# Patient Record
Sex: Female | Born: 1960 | Race: White | Hispanic: No | Smoking: Former smoker
Health system: Southern US, Community
[De-identification: ages and names within clinical notes are randomized; demographics above are authoritative.]

## PROBLEM LIST (undated history)

## (undated) DIAGNOSIS — R112 Nausea with vomiting, unspecified: Secondary | ICD-10-CM

## (undated) DIAGNOSIS — E039 Hypothyroidism, unspecified: Secondary | ICD-10-CM

## (undated) DIAGNOSIS — I10 Essential (primary) hypertension: Secondary | ICD-10-CM

## (undated) DIAGNOSIS — K219 Gastro-esophageal reflux disease without esophagitis: Secondary | ICD-10-CM

## (undated) HISTORY — PX: COLONOSCOPY: SHX174

## (undated) HISTORY — PX: APPENDECTOMY: SHX54

## (undated) HISTORY — PX: ABLATION: SHX5711

---

## 2000-06-25 ENCOUNTER — Other Ambulatory Visit: Admission: RE | Admit: 2000-06-25 | Discharge: 2000-06-25 | Payer: Self-pay | Admitting: Obstetrics and Gynecology

## 2001-07-05 ENCOUNTER — Other Ambulatory Visit: Admission: RE | Admit: 2001-07-05 | Discharge: 2001-07-05 | Payer: Self-pay | Admitting: Obstetrics and Gynecology

## 2002-07-14 ENCOUNTER — Encounter: Payer: Self-pay | Admitting: Family Medicine

## 2002-07-14 ENCOUNTER — Observation Stay (HOSPITAL_COMMUNITY): Admission: EM | Admit: 2002-07-14 | Discharge: 2002-07-15 | Payer: Self-pay | Admitting: Emergency Medicine

## 2002-08-28 ENCOUNTER — Other Ambulatory Visit: Admission: RE | Admit: 2002-08-28 | Discharge: 2002-08-28 | Payer: Self-pay | Admitting: Obstetrics and Gynecology

## 2003-07-23 ENCOUNTER — Other Ambulatory Visit: Admission: RE | Admit: 2003-07-23 | Discharge: 2003-07-23 | Payer: Self-pay | Admitting: Internal Medicine

## 2003-11-01 ENCOUNTER — Other Ambulatory Visit: Admission: RE | Admit: 2003-11-01 | Discharge: 2003-11-01 | Payer: Self-pay | Admitting: Obstetrics and Gynecology

## 2004-06-23 ENCOUNTER — Ambulatory Visit (HOSPITAL_COMMUNITY): Admission: RE | Admit: 2004-06-23 | Discharge: 2004-06-23 | Payer: Self-pay | Admitting: Internal Medicine

## 2004-11-05 ENCOUNTER — Other Ambulatory Visit: Admission: RE | Admit: 2004-11-05 | Discharge: 2004-11-05 | Payer: Self-pay | Admitting: Obstetrics and Gynecology

## 2007-01-20 ENCOUNTER — Encounter (INDEPENDENT_AMBULATORY_CARE_PROVIDER_SITE_OTHER): Payer: Self-pay | Admitting: Obstetrics and Gynecology

## 2007-01-20 ENCOUNTER — Ambulatory Visit (HOSPITAL_COMMUNITY): Admission: RE | Admit: 2007-01-20 | Discharge: 2007-01-20 | Payer: Self-pay | Admitting: Obstetrics and Gynecology

## 2008-12-21 ENCOUNTER — Emergency Department (HOSPITAL_COMMUNITY): Admission: EM | Admit: 2008-12-21 | Discharge: 2008-12-21 | Payer: Self-pay | Admitting: Emergency Medicine

## 2010-03-18 ENCOUNTER — Ambulatory Visit (HOSPITAL_COMMUNITY): Admission: RE | Admit: 2010-03-18 | Discharge: 2010-03-18 | Payer: Self-pay | Admitting: Internal Medicine

## 2010-04-03 IMAGING — US US ABDOMEN COMPLETE
1 series · 14 of 25 positions shown · non-contrast
Comparison: None

CLINICAL DATA: Abdominal pain.

ABDOMINAL ULTRASOUND COMPLETE

[Series 1: us abdomen complete · 0.32mm/px · 14 of 77 slices shown]
[im 1/77]
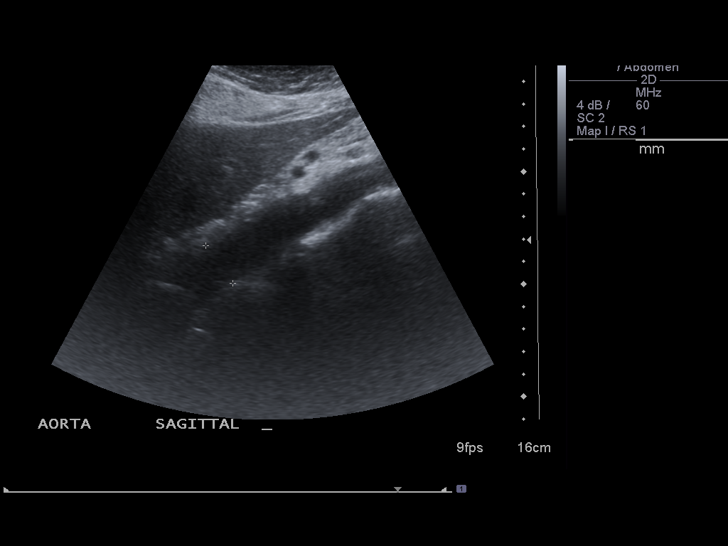
[im 7/77]
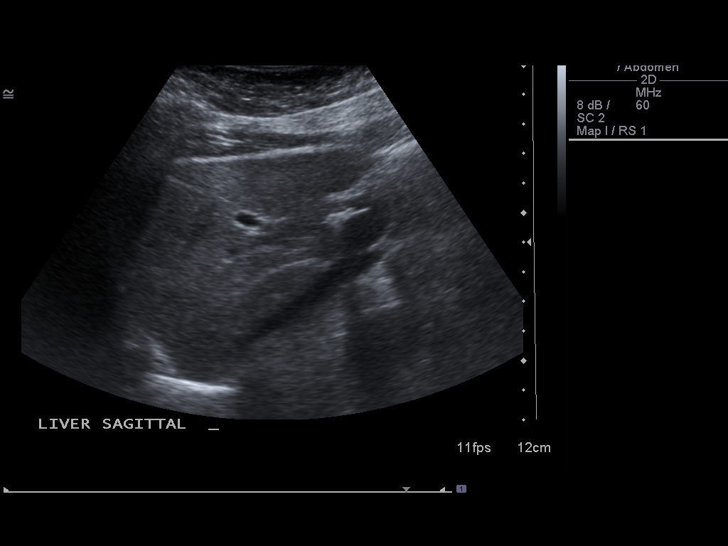
[im 13/77]
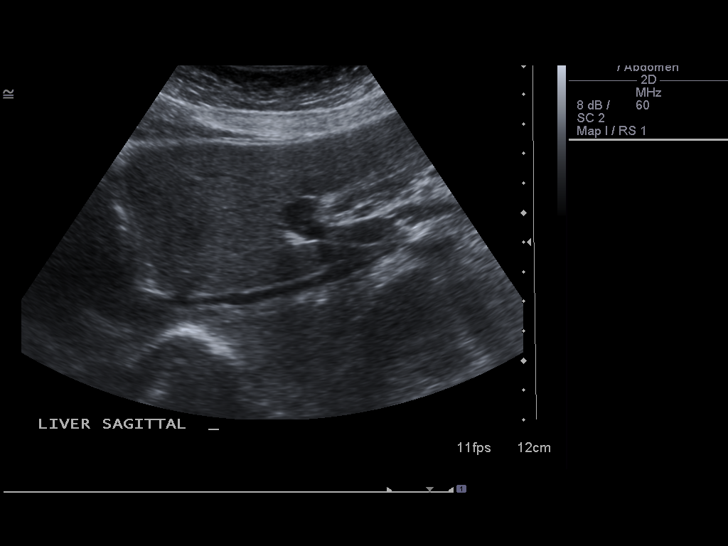
[im 20/77]
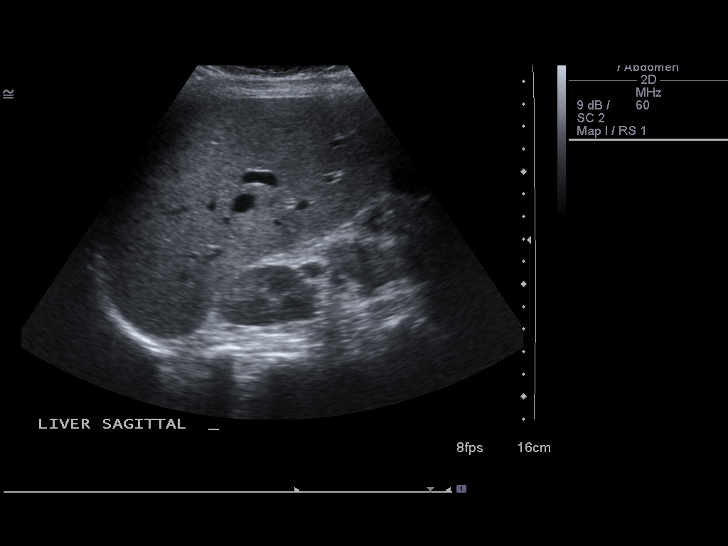
[im 26/77]
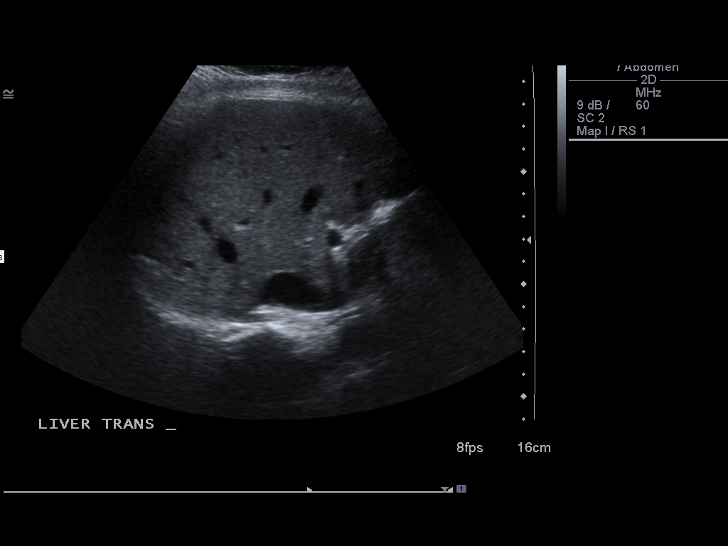
[im 29/77]
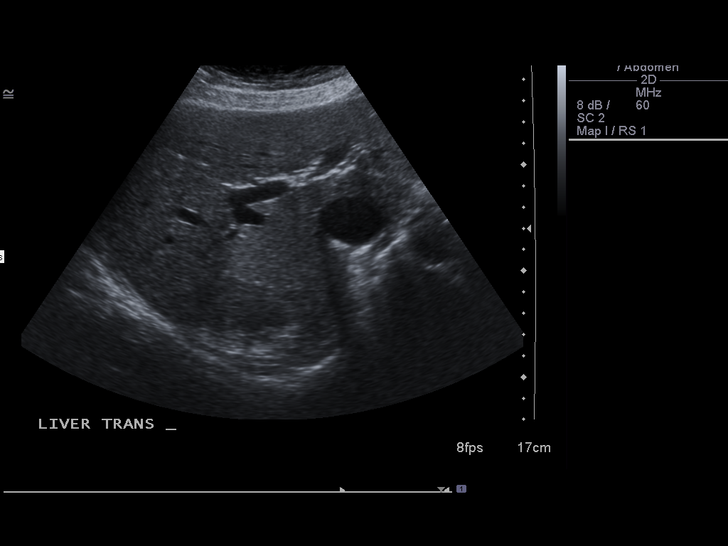
[im 35/77]
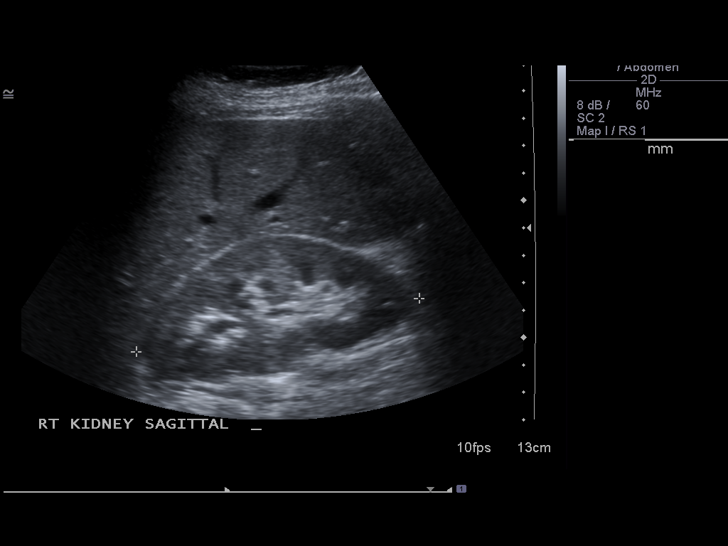
[im 42/77]
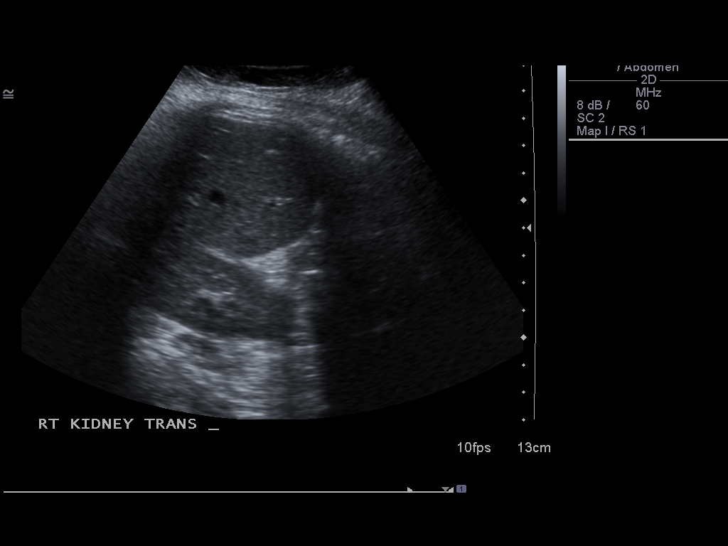
[im 48/77]
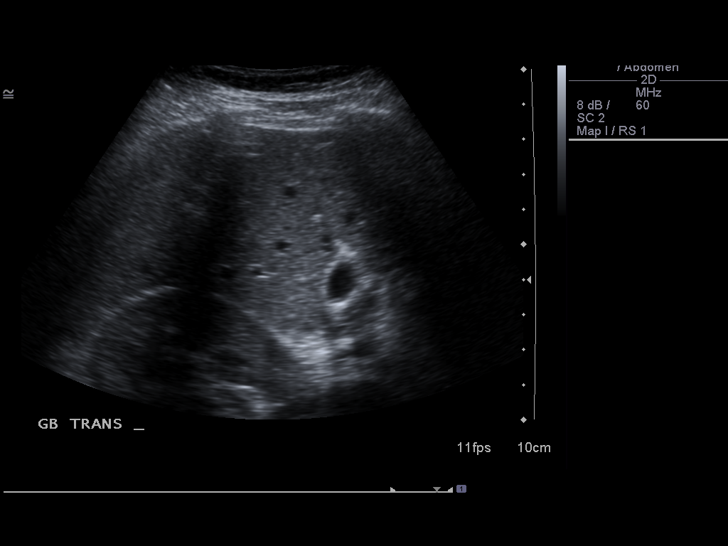
[im 51/77]
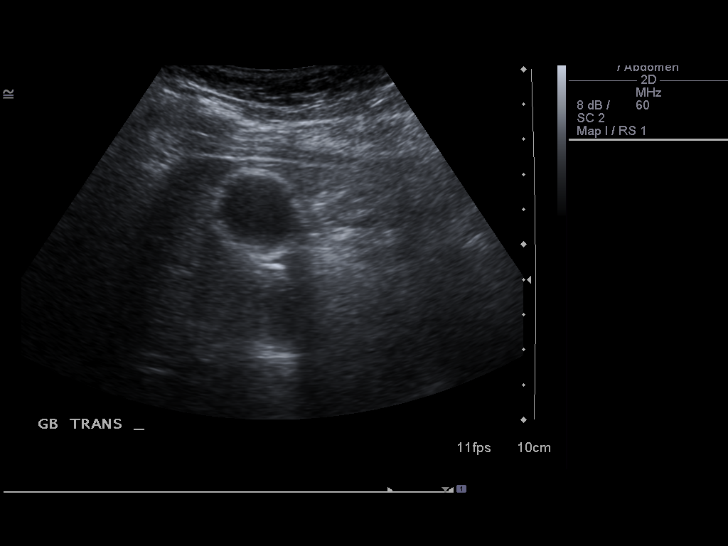
[im 58/77]
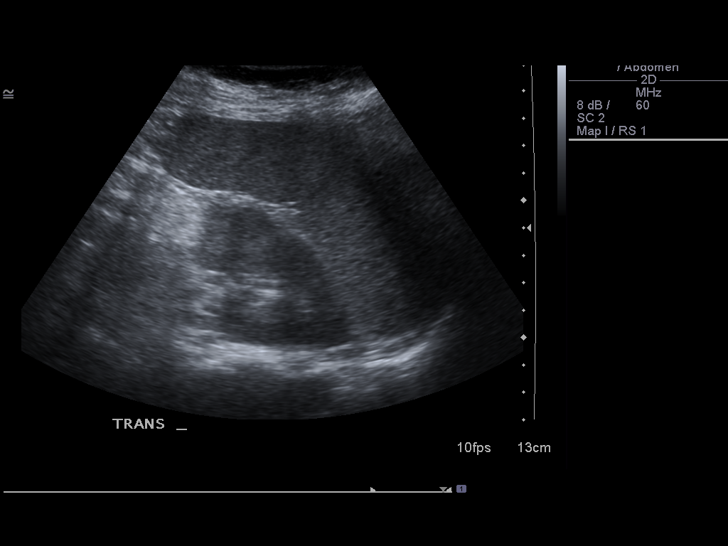
[im 64/77]
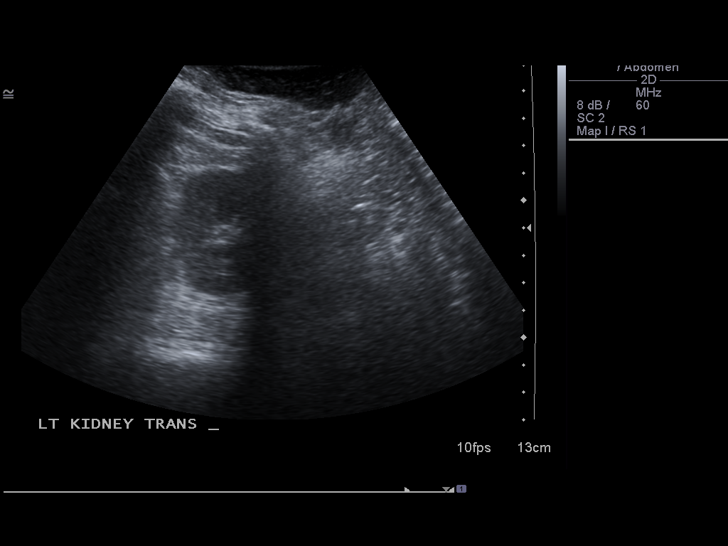
[im 70/77]
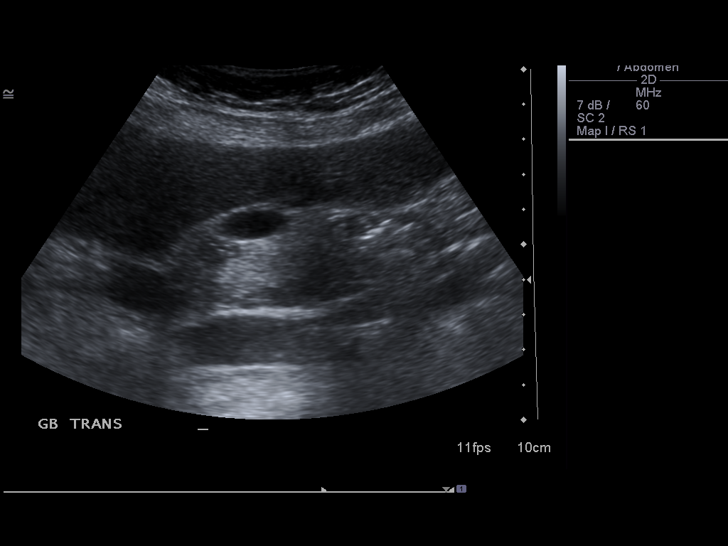
[im 77/77]
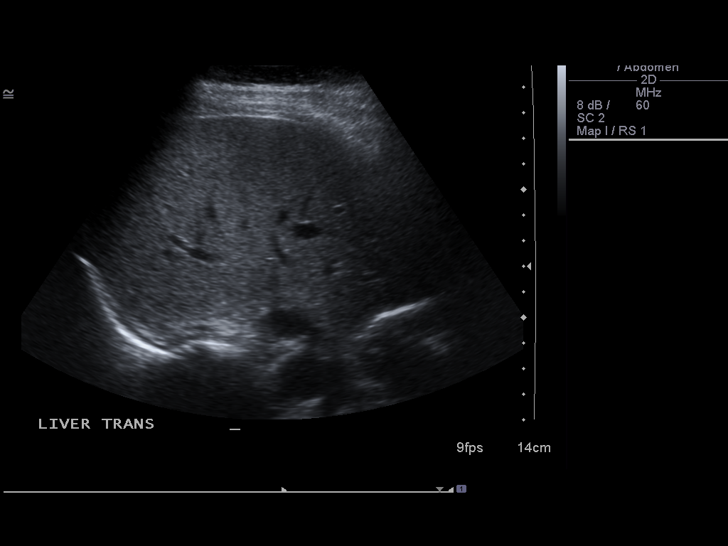

[14 of 25 positions shown; findings below may reference images not displayed]

FINDINGS: Gallbladder:  No gallstones, gallbladder wall thickening, or
pericholecystic fluid. 

Common bile duct: Within normal limits in caliber.

Liver:  No focal lesion identified.  Within normal limits in
parenchymal echogenicity.

Inferior vena cava:  Intrahepatic portion appears normal.

Pancreas:  Although entire pancreas not visualized due to bowel
gas, no abnormality identified.

Spleen:  Within normal limits in size and echogenicity.

Right kidney:  Normal in size and parenchymal echogenicity.  No
evidence of mass or hydronephrosis.

Left kidney:  Normal in size and parenchymal echogenicity.   No
evidence of mass or hydronephrosis.

Abdominal aorta:  No aneurysm identified.
IMPRESSION: Negative abdominal ultrasound.

## 2010-12-03 LAB — COMPREHENSIVE METABOLIC PANEL
ALT: 14 U/L (ref 0–35)
AST: 25 U/L (ref 0–37)
Albumin: 3.9 g/dL (ref 3.5–5.2)
Alkaline Phosphatase: 75 U/L (ref 39–117)
BUN: 21 mg/dL (ref 6–23)
CO2: 24 mEq/L (ref 19–32)
Calcium: 9.7 mg/dL (ref 8.4–10.5)
Chloride: 101 mEq/L (ref 96–112)
Creatinine, Ser: 1.93 mg/dL — ABNORMAL HIGH (ref 0.4–1.2)
GFR calc Af Amer: 34 mL/min — ABNORMAL LOW (ref >60)
GFR calc non Af Amer: 28 mL/min — ABNORMAL LOW (ref >60)
Glucose, Bld: 92 mg/dL (ref 70–99)
Potassium: 4.1 mEq/L (ref 3.5–5.1)
Sodium: 135 mEq/L (ref 135–145)
Total Bilirubin: 0.7 mg/dL (ref 0.3–1.2)
Total Protein: 7.1 g/dL (ref 6.0–8.3)

## 2010-12-03 LAB — DIFFERENTIAL
Basophils Absolute: 0.1 10*3/uL (ref 0.0–0.1)
Basophils Relative: 1 % (ref 0–1)
Eosinophils Absolute: 0 10*3/uL (ref 0.0–0.7)
Eosinophils Relative: 0 % (ref 0–5)
Lymphocytes Relative: 12 % (ref 12–46)
Lymphs Abs: 1.1 10*3/uL (ref 0.7–4.0)
Monocytes Absolute: 1 10*3/uL (ref 0.1–1.0)
Monocytes Relative: 11 % (ref 3–12)
Neutro Abs: 6.9 10*3/uL (ref 1.7–7.7)
Neutrophils Relative %: 76 % (ref 43–77)

## 2010-12-03 LAB — CBC
HCT: 40.1 % (ref 36.0–46.0)
Hemoglobin: 14.1 g/dL (ref 12.0–15.0)
MCHC: 35.2 g/dL (ref 30.0–36.0)
MCV: 89.9 fL (ref 78.0–100.0)
Platelets: 229 10*3/uL (ref 150–400)
RBC: 4.46 MIL/uL (ref 3.87–5.11)
RDW: 13.5 % (ref 11.5–15.5)
WBC: 9.1 10*3/uL (ref 4.0–10.5)

## 2010-12-03 LAB — URINALYSIS, ROUTINE W REFLEX MICROSCOPIC
Bilirubin Urine: NEGATIVE
Glucose, UA: NEGATIVE mg/dL
Hgb urine dipstick: NEGATIVE
Ketones, ur: NEGATIVE mg/dL
Leukocytes, UA: NEGATIVE
Nitrite: NEGATIVE
Protein, ur: 30 mg/dL — AB
Specific Gravity, Urine: 1.012 (ref 1.005–1.030)
Urobilinogen, UA: 0.2 mg/dL (ref 0.0–1.0)
pH: 5 (ref 5.0–8.0)

## 2010-12-03 LAB — POCT PREGNANCY, URINE: Preg Test, Ur: NEGATIVE

## 2010-12-03 LAB — URINE MICROSCOPIC-ADD ON

## 2010-12-03 LAB — LIPASE, BLOOD: Lipase: 19 U/L (ref 11–59)

## 2011-01-06 NOTE — Op Note (Signed)
Erica Hamilton, Erica Hamilton                ACCOUNT NO.:  [id number]   MEDICAL RECORD NO.:  [id number]          PATIENT TYPE:  AMB   LOCATION:  SDC                           FACILITY:  [facility]   PHYSICIAN:  Malva Limes, M.D.    DATE OF BIRTH:  1961/02/04   DATE OF PROCEDURE:  01/20/2007  DATE OF DISCHARGE:                               OPERATIVE REPORT   PREOPERATIVE DIAGNOSES:  1. Menorrhagia.  2. Submucous fibroid.   POSTOPERATIVE DIAGNOSES:  1. Menorrhagia.  2. Submucous fibroid.   PROCEDURE:  1. Hysteroscopy.  2. Dilation curettage.  3. NovaSure endometrial ablation.   SURGEON:  Malva Limes, M.D.   ANESTHESIA:  General endotracheal.   ANTIBIOTICS:  Ancef 1 g.   ESTIMATED BLOOD LOSS:  Minimal.   SPECIMENS:  Endometrial curettings sent to pathology.   COMPLICATIONS:  None.   DESCRIPTION OF PROCEDURE:  The patient was taken to the operating room  where she was placed in the dorsal supine position.  A general  anesthetic was administered without complications.  She was then placed  in the dorsal lithotomy position.  She was prepped with Betadine and her  bladder was drained with a catheter.   A sterile speculum was placed in the vagina; 10 mL of 1% lidocaine was  used for a paracervical block.  The cervix was then serially dilated to  a 42 [place].  The hysteroscope was advanced into the uterine cavity  where there was no evidence of any polyps.  There was one submucous  fibroid on the anterior left cornual area, which only minimally invaded  the uterine cavity.  It was felt that endometrial ablation with NovaSure  would easily be accomplished with this submucous fibroid.   At that point, the hysteroscope was removed.  A D&C was performed.  The  uterus was sounded to 8 cm; the cervical length was 2.5 cm; giving an  intracavitary length of 5.5 cm.  The NovaSure device was placed into the  uterine cavity and opened the width was 3.8 cm.  A seal test was  performed in past.   The NovaSure device was turned on for 70  seconds getting a total wattage 115.  This concluded the procedure.  The  patient was taken to the recovery room in stable condition.  Instrument  and lack counts were correct x1.  The patient will be discharged to home  with Percocet taken p.r.n.  She will follow up in the office in 4 weeks.           ______________________________  Malva Limes, M.D.     MA/MEDQ  D:  01/20/2007  T:  01/20/2007  Job:  [id number]

## 2011-01-09 NOTE — Discharge Summary (Signed)
Erica Hamilton, Erica Hamilton                            ACCOUNT NO.:  [id number]   MEDICAL RECORD NO.:  [id number]                   PATIENT TYPE:  INP   LOCATION:  4736                                 FACILITY:  [facility]   PHYSICIAN:  Dani Gobble, M.D.                  DATE OF BIRTH:  1961/01/25   DATE OF ADMISSION:  07/14/2002  DATE OF DISCHARGE:  07/15/2002                                 DISCHARGE SUMMARY   ADMISSION DIAGNOSES:  1. Hypertension.  2. Chest pain.  3. Tachycardia.  4. Recent use of high caffeine with weight loss medications.   DISCHARGE DIAGNOSES:  1. Hypertension.  2. Chest pain.  3. Tachycardia.  4. Recent use of high caffeine with weight loss medications.   HISTORY OF PRESENT ILLNESS:  The patient is a 50 year old white female who  had been in her usual state of good health.  She had been taking Zantrex-3  and the last dose was at lunch on the day prior to admission; as well, she  had been experiencing some sinus symptoms and she took over-the-counter  sinus medicines.  Over the past two to three nights, she has been feeling  her heart rate and her heart rate has been feeling as if it were increasing  and feels as though she could not get a breath in very well.  On the day of  admission, she also had chest heaviness and a perceived chest pain which was  somewhat dull.  She was seen at the urgent care and was noted to have  nonspecific ST-T changes on EKG and was sent to the ER for further  evaluation.  She is currently pain-free.   On exam at that time, blood pressure was 123/91, heart rate 78, oxygen  saturation 99% on room air.  No significant findings on physical exam.   EKG #1 showed sinus tachycardia at 100 beats a minute, nonspecific changes.  EKG #2 showed heart rate of 78, normal sinus rhythm, no acute change.  Initial labs are stable.   At this time, she is being evaluated by Dr. Dani Gobble.  It was reviewed  that she had been taking this Zantrex-3,  which has extremely high levels of  caffeine in it and it is a diet drug which over the counter; as well, she  had been using some over-the-counter medication for her sinus congestion.  At that time, she was planned for admission to telemetry ______________ for  rule out MI.  She would be placed on aspirin, heparin and beta blocker and  nitroglycerin as needed.  She was scheduled for 2-D echocardiogram and was  planned for observation overnight.   HOSPITAL COURSE:  On the morning of July 15, 2002, the patient has  remained stable.  Her heart rate is 60, blood pressure is 115/65.  Enzymes  were negative and EKG continues to show no changes.  She had no risk factors  for coronary disease and it was felt that her symptoms were atypical for  angina.  At that time, we stopped the heparin, had her ambulate in the  hallway.  She remained stable and was discharged home later that afternoon.   HOSPITAL CONSULTATIONS:  None.   HOSPITAL PROCEDURES:  None.   LABORATORY AND ACCESSORY CLINICAL DATA:  Urinalysis is negative.  Blood  screen is positive for amphetamines, otherwise, they were negative.  TSH is  1.460.  Urine pregnancy is negative.  Cardiac enzymes show CKs of 91, 79 and  62; MBs of 0.9, 0.6, 0.6; troponin is 0.2, less than 0.1 and 0.1.  Liver  function tests are normal.  Sodium is 136, potassium 3.8, chloride 106, CO2  25, glucose 87, BUN 14, creatinine 1.0.  White count 7.1, hemoglobin 13.2,  hematocrit 38.6 and platelets 328,000.  PT 13.5, INR 1.0, PTT 35;  thereafter, the PTT was elevated on IV heparin.   EKG shows normal sinus rhythm with nonspecific ST-T change.   DISCHARGE MEDICATIONS:  1. Birth control pills.  2. Zantac.   SPECIAL DISCHARGE INSTRUCTIONS:  1. Stop taking diet pills.  2  Avoid caffeine and stimulants.  1. Call [phone] and make an appointment to be seen in the future if she has     any further cardiac problems.   FOLLOWUP:  We will be seeing her on an  as-needed basis.     Erica Hamilton, P.A.-C.                   Dani Gobble, M.D.    MBE/MEDQ  D:  07/18/2002  T:  07/19/2002  Job:  [id number]

## 2011-02-27 ENCOUNTER — Emergency Department (HOSPITAL_COMMUNITY): Payer: 59

## 2011-02-27 ENCOUNTER — Emergency Department (HOSPITAL_COMMUNITY)
Admission: EM | Admit: 2011-02-27 | Discharge: 2011-02-27 | Disposition: A | Discharge status: Home / Self Care | Payer: 59 | Attending: Emergency Medicine | Admitting: Emergency Medicine

## 2011-02-27 DIAGNOSIS — R079 Chest pain, unspecified: Secondary | ICD-10-CM | POA: Insufficient documentation

## 2011-02-27 LAB — DIFFERENTIAL
Basophils Absolute: 0 10*3/uL (ref 0.0–0.1)
Basophils Relative: 1 % (ref 0–1)
Eosinophils Absolute: 0.1 10*3/uL (ref 0.0–0.7)
Eosinophils Relative: 2 % (ref 0–5)
Lymphocytes Relative: 42 % (ref 12–46)
Lymphs Abs: 2.4 10*3/uL (ref 0.7–4.0)
Monocytes Absolute: 0.5 10*3/uL (ref 0.1–1.0)
Monocytes Relative: 9 % (ref 3–12)
Neutro Abs: 2.7 10*3/uL (ref 1.7–7.7)
Neutrophils Relative %: 46 % (ref 43–77)

## 2011-02-27 LAB — CK TOTAL AND CKMB (NOT AT ARMC)
CK, MB: 1.5 ng/mL (ref 0.3–4.0)
Relative Index: INVALID (ref 0.0–2.5)
Total CK: 62 U/L (ref 7–177)

## 2011-02-27 LAB — CBC
HCT: 40.7 % (ref 36.0–46.0)
Hemoglobin: 13.9 g/dL (ref 12.0–15.0)
MCH: 30.8 pg (ref 26.0–34.0)
MCHC: 34.2 g/dL (ref 30.0–36.0)
MCV: 90.2 fL (ref 78.0–100.0)
Platelets: 232 10*3/uL (ref 150–400)
RBC: 4.51 MIL/uL (ref 3.87–5.11)
RDW: 12.2 % (ref 11.5–15.5)
WBC: 5.7 10*3/uL (ref 4.0–10.5)

## 2011-02-27 LAB — BASIC METABOLIC PANEL
BUN: 16 mg/dL (ref 6–23)
CO2: 26 mEq/L (ref 19–32)
Calcium: 9.9 mg/dL (ref 8.4–10.5)
Chloride: 100 mEq/L (ref 96–112)
Creatinine, Ser: 0.73 mg/dL (ref 0.50–1.10)
GFR calc Af Amer: >60 mL/min (ref >60)
GFR calc non Af Amer: >60 mL/min (ref >60)
Glucose, Bld: 97 mg/dL (ref 70–99)
Potassium: 4.1 mEq/L (ref 3.5–5.1)
Sodium: 137 mEq/L (ref 135–145)

## 2011-02-27 LAB — TROPONIN I: Troponin I: <0.3 ng/mL (ref <0.30)

## 2012-04-04 ENCOUNTER — Ambulatory Visit (HOSPITAL_COMMUNITY)
Admission: RE | Admit: 2012-04-04 | Discharge: 2012-04-04 | Disposition: A | Discharge status: Home / Self Care | Payer: 59 | Source: Ambulatory Visit | Attending: Internal Medicine | Admitting: Internal Medicine

## 2012-04-04 ENCOUNTER — Other Ambulatory Visit (HOSPITAL_COMMUNITY): Payer: Self-pay | Admitting: Internal Medicine

## 2012-04-04 DIAGNOSIS — S93401A Sprain of unspecified ligament of right ankle, initial encounter: Secondary | ICD-10-CM

## 2012-04-04 DIAGNOSIS — M25579 Pain in unspecified ankle and joints of unspecified foot: Secondary | ICD-10-CM | POA: Insufficient documentation

## 2012-09-16 ENCOUNTER — Encounter (HOSPITAL_COMMUNITY): Payer: Self-pay | Admitting: Emergency Medicine

## 2012-09-16 ENCOUNTER — Emergency Department (HOSPITAL_COMMUNITY)
Admission: EM | Admit: 2012-09-16 | Discharge: 2012-09-16 | Disposition: A | Discharge status: Home / Self Care | Payer: 59 | Attending: Emergency Medicine | Admitting: Emergency Medicine

## 2012-09-16 ENCOUNTER — Emergency Department (HOSPITAL_COMMUNITY): Payer: 59

## 2012-09-16 DIAGNOSIS — R0789 Other chest pain: Secondary | ICD-10-CM | POA: Insufficient documentation

## 2012-09-16 DIAGNOSIS — Z79899 Other long term (current) drug therapy: Secondary | ICD-10-CM | POA: Insufficient documentation

## 2012-09-16 DIAGNOSIS — E039 Hypothyroidism, unspecified: Secondary | ICD-10-CM | POA: Insufficient documentation

## 2012-09-16 HISTORY — DX: Hypothyroidism, unspecified: E03.9

## 2012-09-16 LAB — CBC WITH DIFFERENTIAL/PLATELET
Basophils Absolute: 0.1 10*3/uL (ref 0.0–0.1)
Basophils Relative: 1 % (ref 0–1)
Eosinophils Absolute: 0.1 10*3/uL (ref 0.0–0.7)
Eosinophils Relative: 3 % (ref 0–5)
HCT: 43.9 % (ref 36.0–46.0)
Hemoglobin: 14.6 g/dL (ref 12.0–15.0)
Lymphocytes Relative: 39 % (ref 12–46)
Lymphs Abs: 2.1 10*3/uL (ref 0.7–4.0)
MCH: 30 pg (ref 26.0–34.0)
MCHC: 33.3 g/dL (ref 30.0–36.0)
MCV: 90.3 fL (ref 78.0–100.0)
Monocytes Absolute: 0.5 10*3/uL (ref 0.1–1.0)
Monocytes Relative: 10 % (ref 3–12)
Neutro Abs: 2.5 10*3/uL (ref 1.7–7.7)
Neutrophils Relative %: 48 % (ref 43–77)
Platelets: 245 10*3/uL (ref 150–400)
RBC: 4.86 MIL/uL (ref 3.87–5.11)
RDW: 13.8 % (ref 11.5–15.5)
WBC: 5.3 10*3/uL (ref 4.0–10.5)

## 2012-09-16 LAB — COMPREHENSIVE METABOLIC PANEL
ALT: 14 U/L (ref 0–35)
AST: 19 U/L (ref 0–37)
Albumin: 4.2 g/dL (ref 3.5–5.2)
Alkaline Phosphatase: 115 U/L (ref 39–117)
BUN: 12 mg/dL (ref 6–23)
CO2: 30 mEq/L (ref 19–32)
Calcium: 9.9 mg/dL (ref 8.4–10.5)
Chloride: 102 mEq/L (ref 96–112)
Creatinine, Ser: 0.77 mg/dL (ref 0.50–1.10)
GFR calc Af Amer: >90 mL/min (ref >90)
GFR calc non Af Amer: >90 mL/min (ref >90)
Glucose, Bld: 103 mg/dL — ABNORMAL HIGH (ref 70–99)
Potassium: 4.2 mEq/L (ref 3.5–5.1)
Sodium: 140 mEq/L (ref 135–145)
Total Bilirubin: 0.3 mg/dL (ref 0.3–1.2)
Total Protein: 7.9 g/dL (ref 6.0–8.3)

## 2012-09-16 LAB — TROPONIN I: Troponin I: <0.3 ng/mL (ref <0.30)

## 2012-09-16 LAB — POCT I-STAT TROPONIN I: Troponin i, poc: 0 ng/mL (ref 0.00–0.08)

## 2012-09-16 NOTE — ED Provider Notes (Signed)
History     CSN: [id number]  Arrival date & time 09/16/12  0259   First MD Initiated Contact with Patient 09/16/12 [phone]      Chief Complaint  Patient presents with  . Chest Pain    (Consider location/radiation/quality/duration/timing/severity/associated sxs/prior treatment) HPI 52 yo female presents to the ER from home with complaint of chest pain.  Pt reports she woke and noted poking sensation to left upper chest.  No diaphoresis, no nausea, no shortness of breath.  No prior h/o same.  Pt is unsure if pain woke her or she woke on her own.  Pt with frequent wakings normally.  Pt recently dx with hypothyroid, placed on synthroid.  Pt also on clonidine for hot flashes associated with menopause.  She denies h/o cad, htn, hyperlipidemia.  No lung disease.  No calf swelling, tenderness, prolonged immobility or h/o pe/dvt.  No family history of CAD.  Sxs have been ongoing since 2am Past Medical History  Diagnosis Date  . Hypothyroidism     History reviewed. No pertinent past surgical history.  No family history on file.  History  Substance Use Topics  . Smoking status: Never Smoker   . Smokeless tobacco: Not on file  . Alcohol Use: Yes    OB History    Grav Para Term Preterm Abortions TAB SAB Ect Mult Living                  Review of Systems  Unable to perform ROS   Allergies  Sulfa antibiotics  Home Medications   Current Outpatient Rx  Name  Route  Sig  Dispense  Refill  . ALKA-SELTZER PO   Oral   Take 1 packet by mouth every 6 (six) hours as needed. For cold medications         . CLONIDINE HCL 0.1 MG PO TABS   Oral   Take 0.1 mg by mouth at bedtime.         . IBUPROFEN 200 MG PO TABS   Oral   Take 400 mg by mouth every 6 (six) hours as needed. For pain         . LEVOTHYROXINE SODIUM 50 MCG PO TABS   Oral   Take 50 mcg by mouth daily.           BP 112/72  Pulse 65  Temp 97.8 F (36.6 C) (Oral)  Resp 14  SpO2 100%  Physical Exam  Nursing  note and vitals reviewed. Constitutional: She is oriented to person, place, and time. She appears well-developed and well-nourished.  HENT:  Head: Normocephalic and atraumatic.  Nose: Nose normal.  Mouth/Throat: Oropharynx is clear and moist.  Eyes: Conjunctivae normal and EOM are normal. Pupils are equal, round, and reactive to light.  Neck: Normal range of motion. Neck supple. No JVD present. No tracheal deviation present. No thyromegaly present.  Cardiovascular: Normal rate, regular rhythm, normal heart sounds and intact distal pulses.  Exam reveals no gallop and no friction rub.   No murmur heard. Pulmonary/Chest: Effort normal and breath sounds normal. No stridor. No respiratory distress. She has no wheezes. She has no rales. She exhibits no tenderness.  Abdominal: Soft. Bowel sounds are normal. She exhibits no distension and no mass. There is no tenderness. There is no rebound and no guarding.  Musculoskeletal: Normal range of motion. She exhibits no edema and no tenderness.  Lymphadenopathy:    She has no cervical adenopathy.  Neurological: She is alert and oriented to  person, place, and time. No cranial nerve deficit. She exhibits normal muscle tone.  Skin: Skin is dry. No rash noted. No erythema. No pallor.  Psychiatric: She has a normal mood and affect. Her behavior is normal. Judgment and thought content normal.    ED Course  Procedures (including critical care time)  Labs Reviewed  COMPREHENSIVE METABOLIC PANEL - Abnormal; Notable for the following:    Glucose, Bld 103 (*)     All other components within normal limits  CBC WITH DIFFERENTIAL  POCT I-STAT TROPONIN I  TROPONIN I   Dg Chest 2 View  09/16/2012  *RADIOLOGY REPORT*  Clinical Data: Palpitations and shortness of breath.  CHEST - 2 VIEW  Comparison: Chest radiograph performed 02/27/2011  Findings: The lungs are well-aerated and clear.  There is no evidence of focal opacification, pleural effusion or pneumothorax.   The heart is normal in size; the mediastinal contour is within normal limits.  No acute osseous abnormalities are seen.  IMPRESSION: No acute cardiopulmonary process seen.   Original Report Authenticated By: Tonia Ghent, M.D.     Date: 09/16/2012  Rate: 66  Rhythm: normal sinus rhythm  QRS Axis: normal  Intervals: normal  ST/T Wave abnormalities: normal  Conduction Disutrbances:none  Narrative Interpretation:   Old EKG Reviewed: unchanged    1. Atypical chest pain       MDM  52 year old female with left upper chest pain when she awoke from sleep this morning at 2 AM. She denies any associated symptoms, she has no risk factors for coronary disease. Pain seems atypical in nature as it is a sharp poke type pain. She has had 2 negative troponins. Her EKG is unremarkable. Patient recently started clonidine for hot flashes and Synthroid for hypothyroidism. Will have her followup with her primary care doctors.        Olivia Mackie, MD 09/16/12 2031

## 2012-09-16 NOTE — ED Notes (Signed)
Pt discharged.Vital signs stable and GCS 15 

## 2012-09-16 NOTE — ED Notes (Signed)
PT. REPORTS WOKE UP FROM SLEEP WITH LEFT CHEST PAIN THIS EVENING , WITH SLIGHT SOB , DENIES NAUSEA OR DIAPHORESIS , STATES RECENT LY DIAGNOSED WITH HYPOTHYROIDISM PRESCRIBED WITH SYNTHROID / CLONIDINE FOR " HOT FLUSHES'.

## 2012-10-25 ENCOUNTER — Other Ambulatory Visit: Payer: Self-pay | Admitting: Gastroenterology

## 2013-07-16 IMAGING — CR DG ANKLE COMPLETE 3+V*R*
3 series · 3 of 3 positions shown · non-contrast
Comparison: None.

CLINICAL DATA: Injury 1 month ago.  Pain lateral aspect.

RIGHT ANKLE - COMPLETE 3+ VIEW

[view not recorded (1 of 3)]
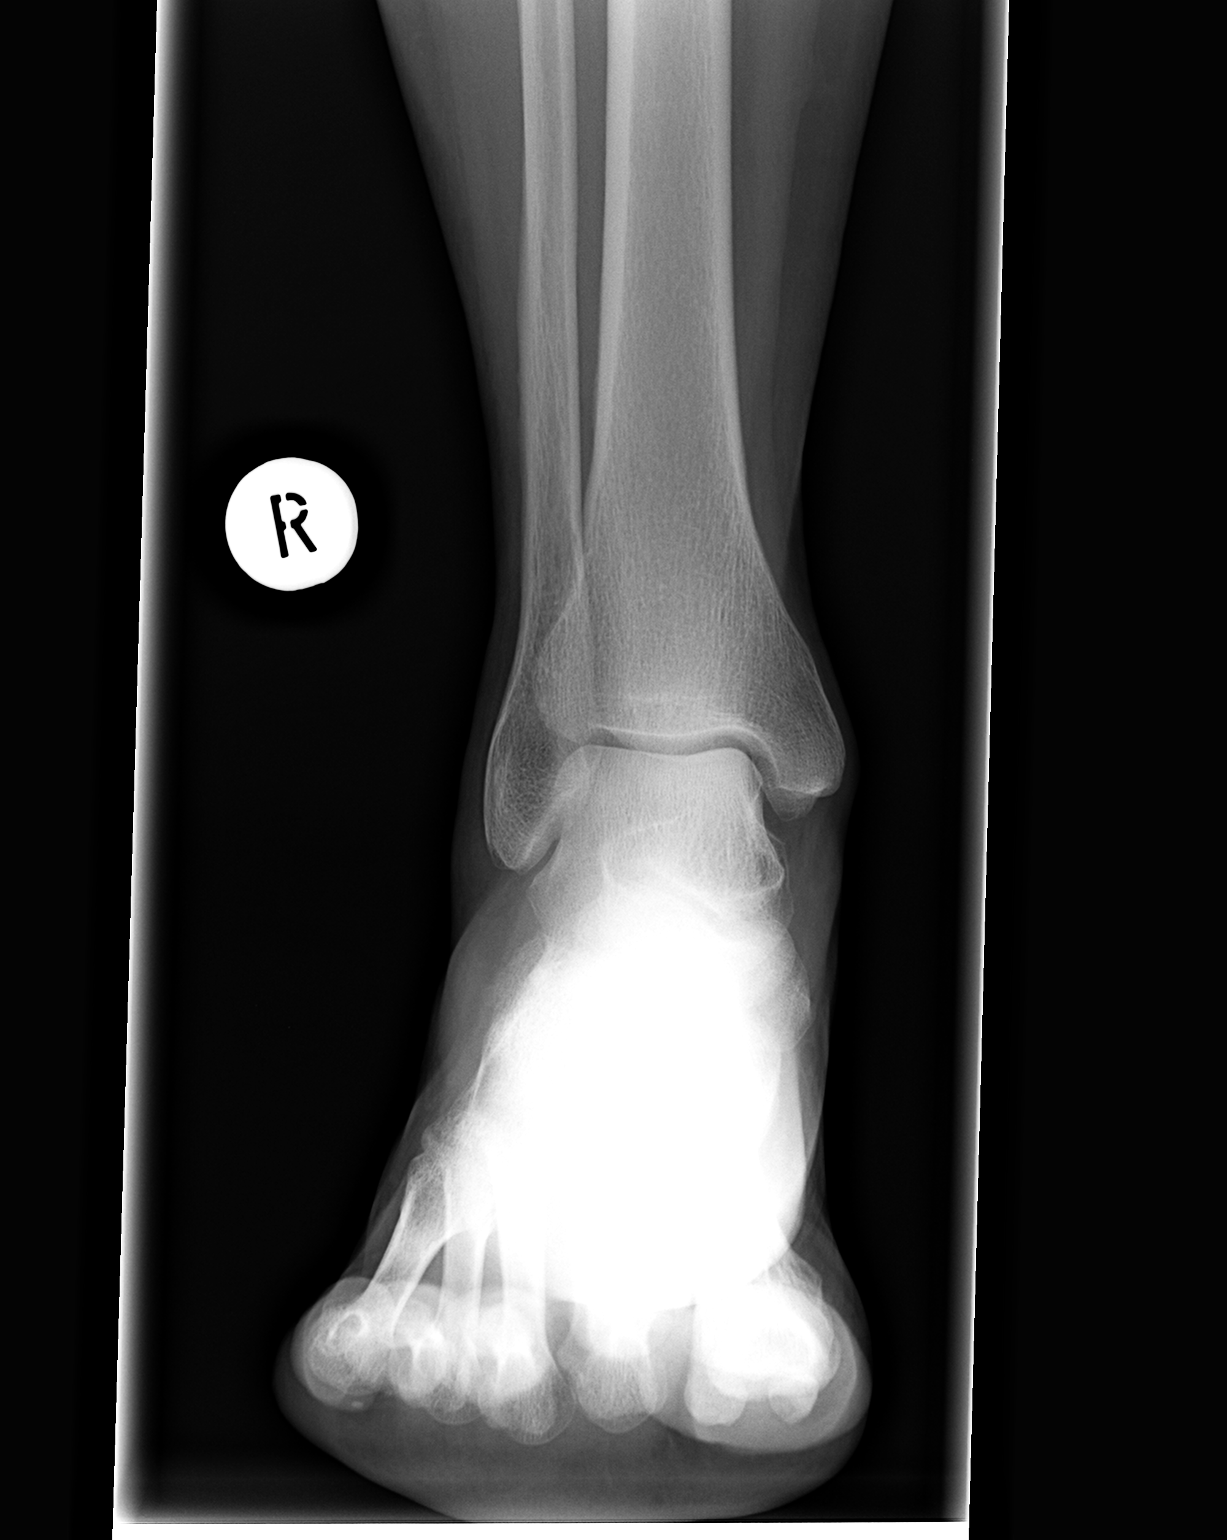

[view not recorded (2 of 3)]
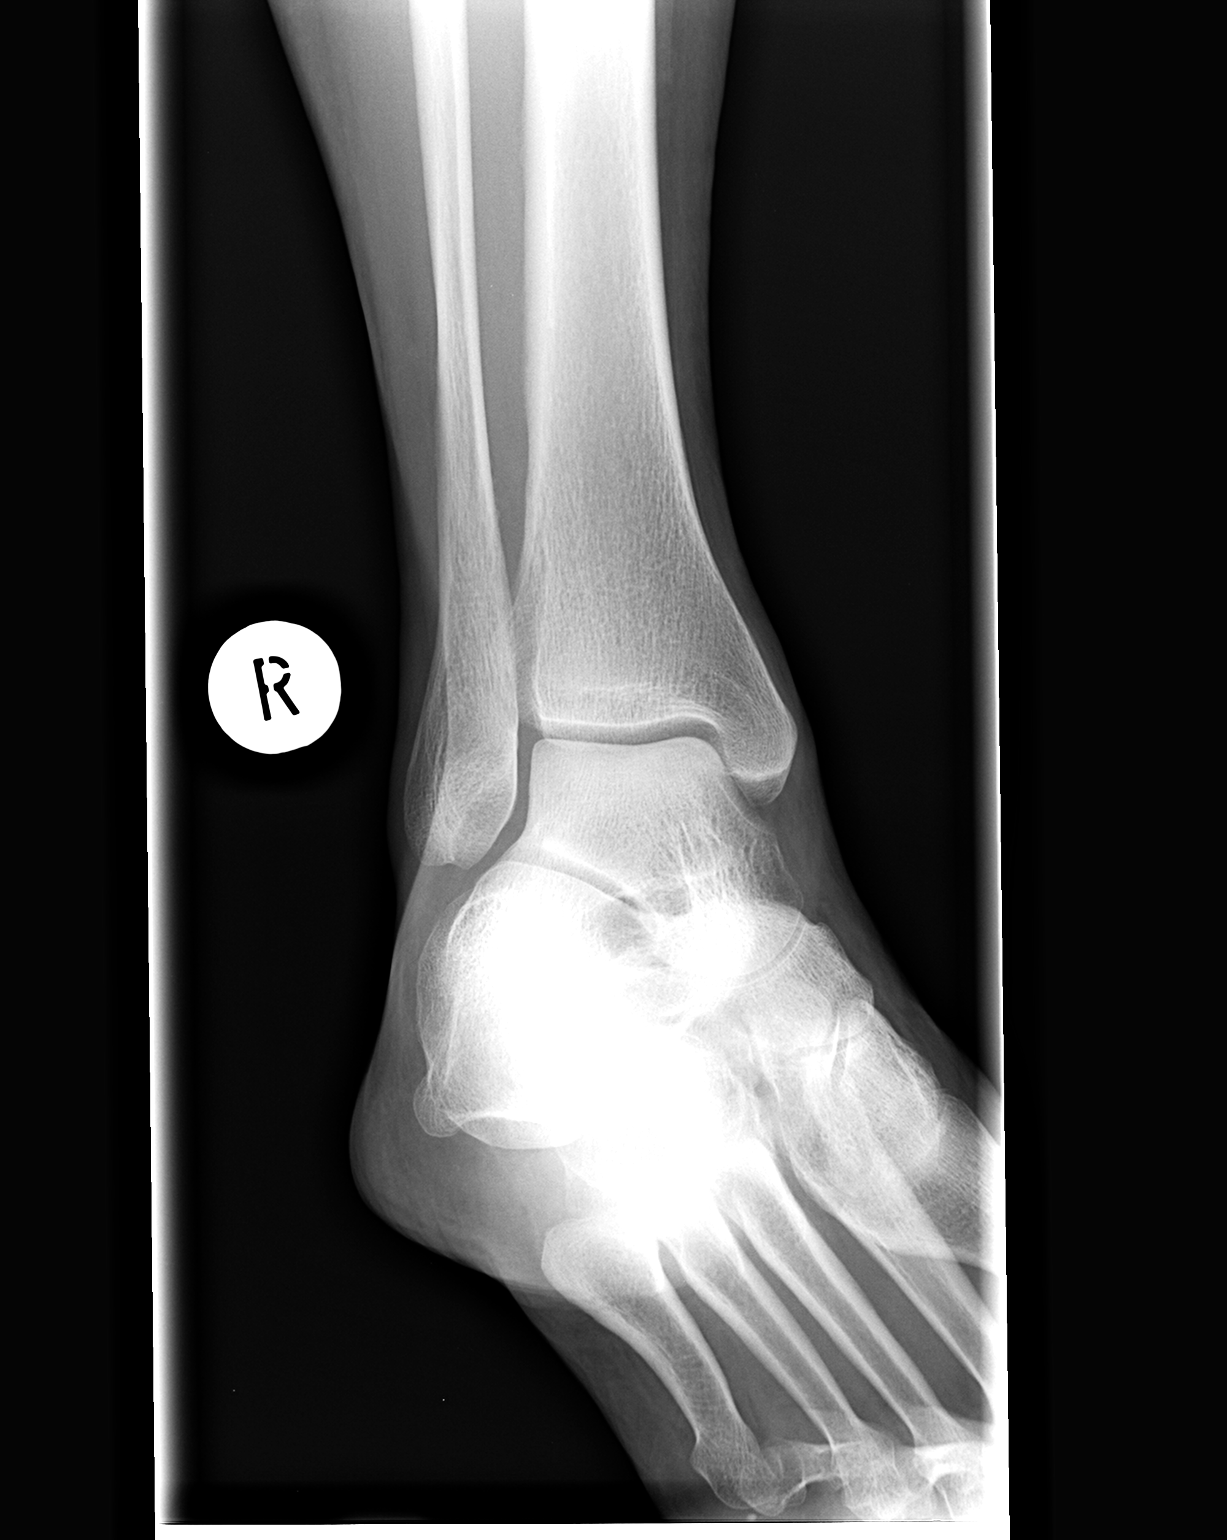

[view not recorded (3 of 3)]
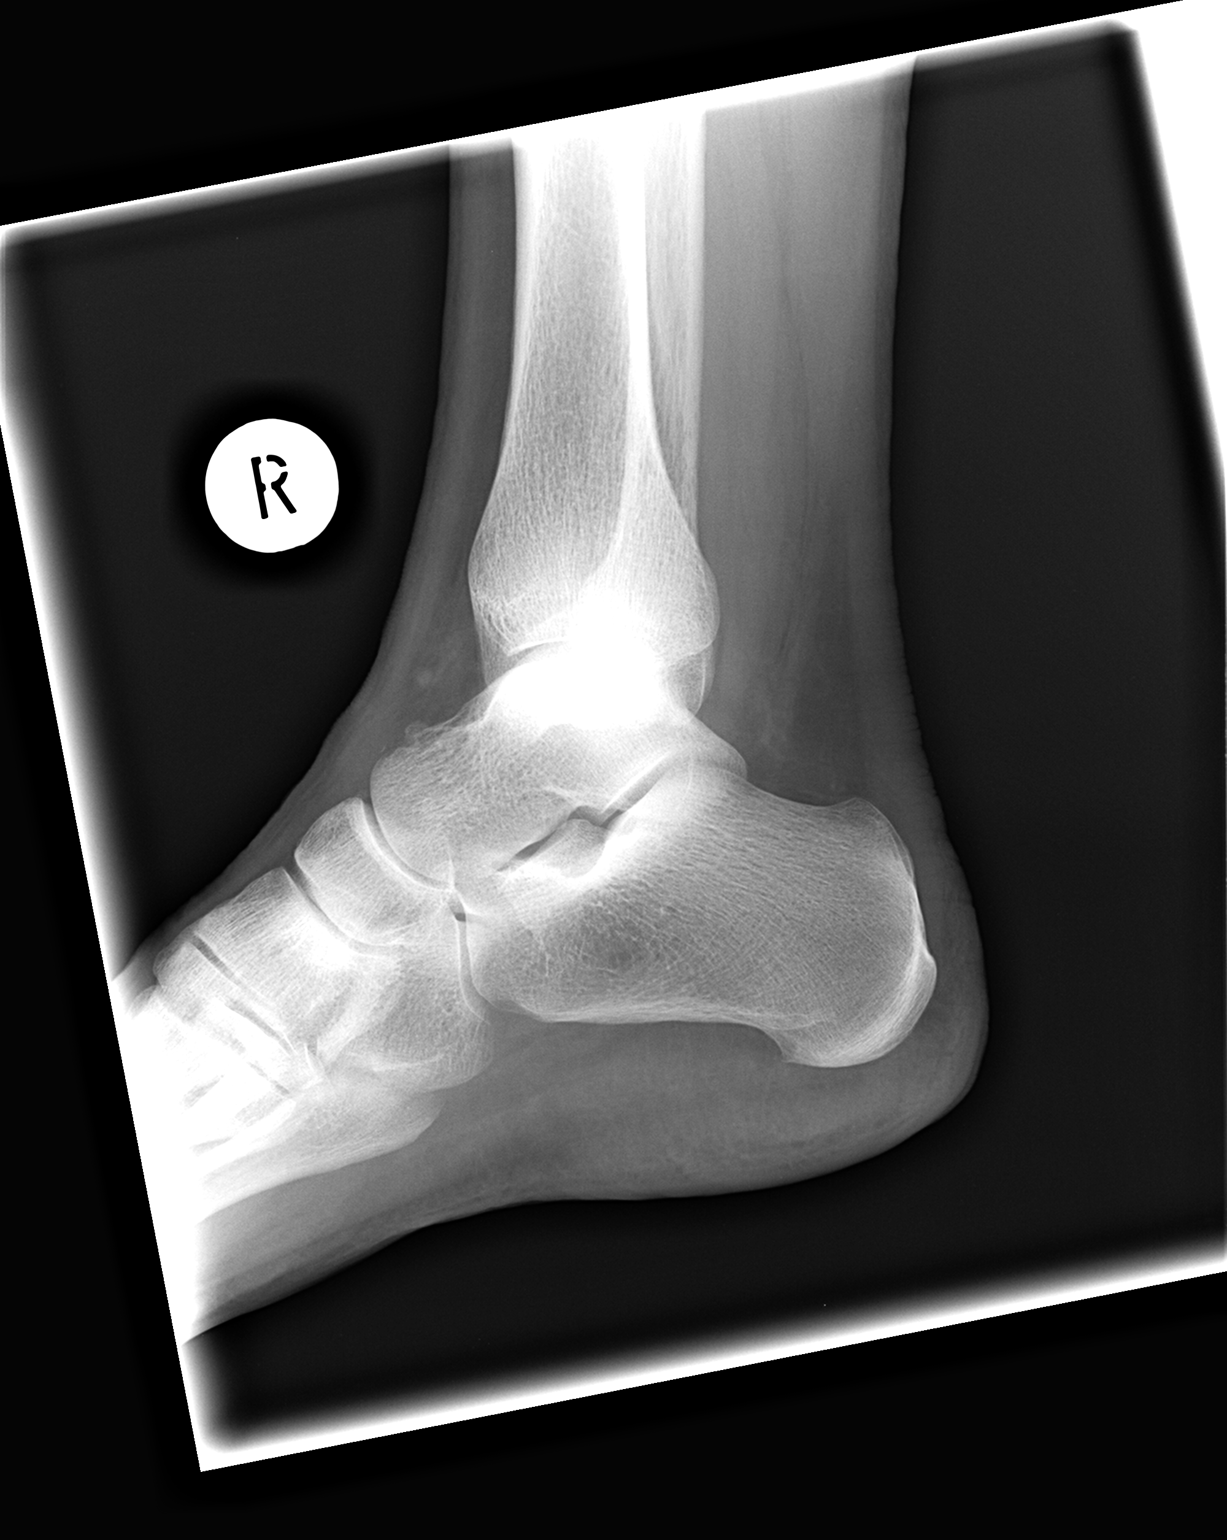

[3 of 3 positions shown; findings below may reference images not displayed]

FINDINGS: Slight right-sided soft tissue swelling.  No fracture or
dislocation.
IMPRESSION: As above.

## 2013-09-19 ENCOUNTER — Other Ambulatory Visit: Payer: Self-pay | Admitting: Obstetrics and Gynecology

## 2013-09-20 ENCOUNTER — Other Ambulatory Visit: Payer: Self-pay | Admitting: Internal Medicine

## 2013-09-20 DIAGNOSIS — E049 Nontoxic goiter, unspecified: Secondary | ICD-10-CM

## 2013-09-26 ENCOUNTER — Ambulatory Visit
Admission: RE | Admit: 2013-09-26 | Discharge: 2013-09-26 | Disposition: A | Discharge status: Home / Self Care | Payer: BC Managed Care – PPO | Source: Ambulatory Visit | Attending: Internal Medicine | Admitting: Internal Medicine

## 2013-09-26 DIAGNOSIS — E049 Nontoxic goiter, unspecified: Secondary | ICD-10-CM

## 2013-10-13 ENCOUNTER — Other Ambulatory Visit: Payer: Self-pay | Admitting: Internal Medicine

## 2013-10-13 DIAGNOSIS — E041 Nontoxic single thyroid nodule: Secondary | ICD-10-CM

## 2013-12-05 ENCOUNTER — Telehealth: Payer: Self-pay | Admitting: *Deleted

## 2013-12-05 NOTE — Telephone Encounter (Signed)
I left the patient a message to call us and inform us of the name of the doctor that treats her for her Diabetes.  We need to know for Diabetic Shoe Program.  The doctor that we used was invalid.

## 2013-12-05 NOTE — Telephone Encounter (Signed)
Error please disregard previous message this is the wrong patient.  This patient called to inform me.

## 2014-04-09 ENCOUNTER — Other Ambulatory Visit: Payer: BC Managed Care – PPO

## 2014-05-14 ENCOUNTER — Ambulatory Visit
Admission: RE | Admit: 2014-05-14 | Discharge: 2014-05-14 | Disposition: A | Discharge status: Home / Self Care | Payer: BC Managed Care – PPO | Source: Ambulatory Visit | Attending: Internal Medicine | Admitting: Internal Medicine

## 2014-05-14 DIAGNOSIS — E041 Nontoxic single thyroid nodule: Secondary | ICD-10-CM

## 2014-10-04 ENCOUNTER — Other Ambulatory Visit: Payer: Self-pay | Admitting: Obstetrics and Gynecology

## 2014-10-05 LAB — CYTOLOGY - PAP

## 2015-08-25 IMAGING — US US SOFT TISSUE HEAD/NECK
1 series · 13 of 25 positions shown · non-contrast
Comparison: Prior thyroid ultrasound 09/26/2013

CLINICAL DATA: Follow thyroid nodules

EXAM:
THYROID ULTRASOUND
TECHNIQUE: Ultrasound examination of the thyroid gland and adjacent soft
tissues was performed.

[Series 1: us soft tissue head/neck · 0.09mm/px · 13 of 79 slices shown]
[im 1/79]
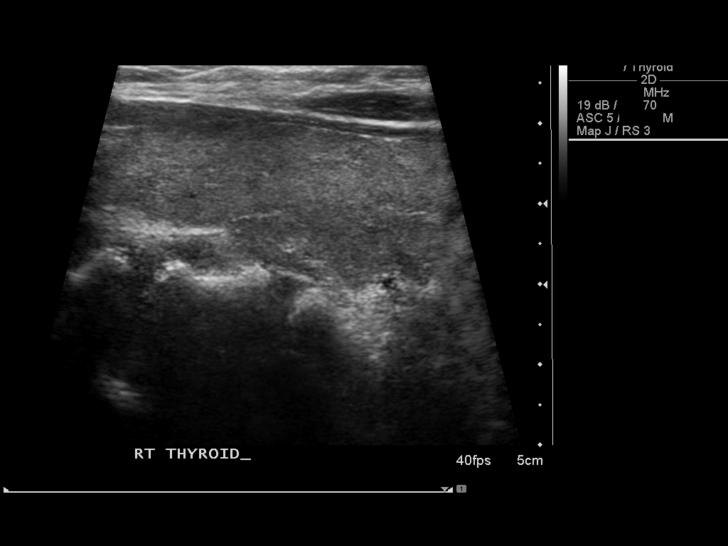
[im 7/79]
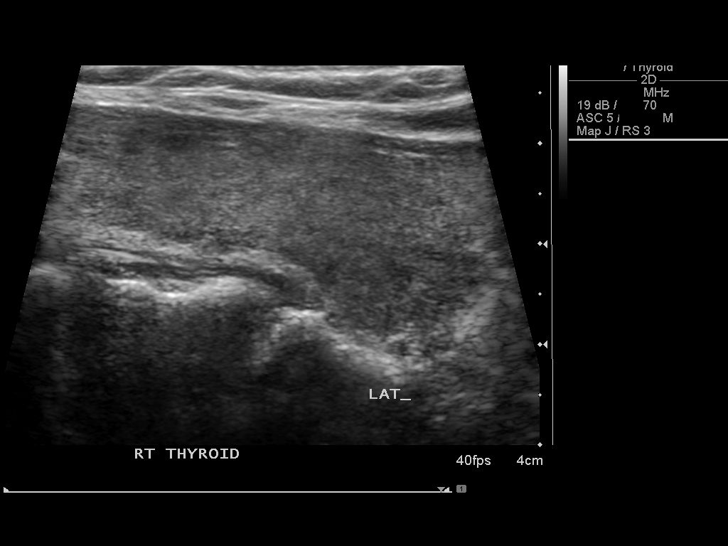
[im 14/79]
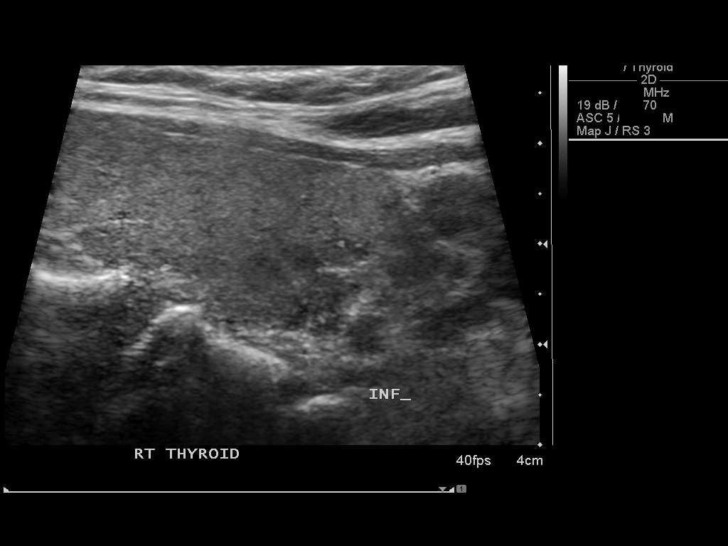
[im 20/79]
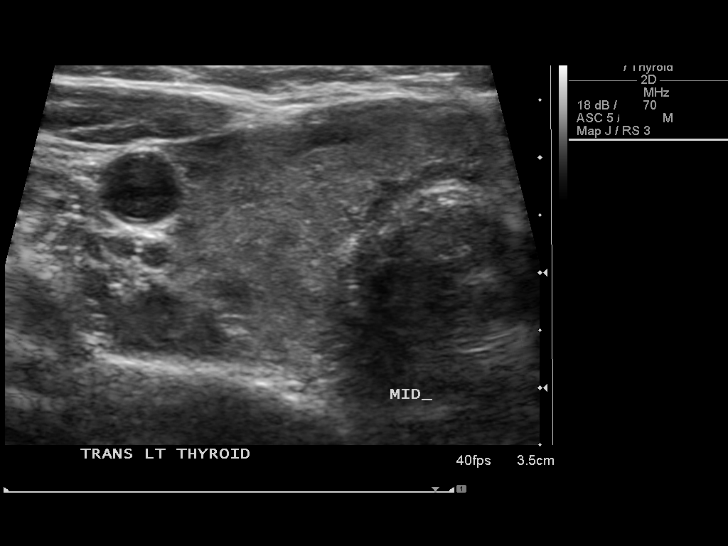
[im 27/79]
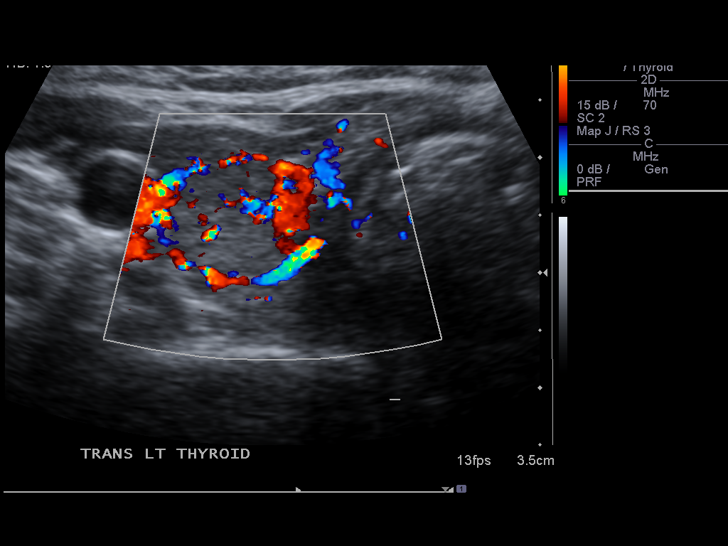
[im 33/79]
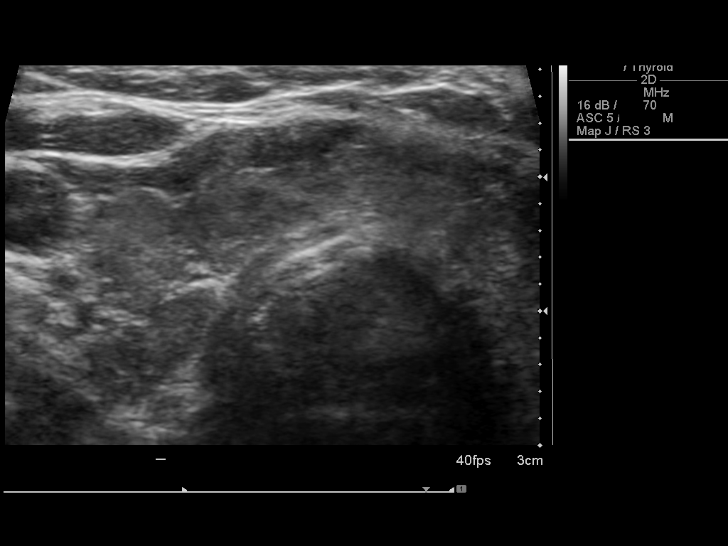
[im 40/79]
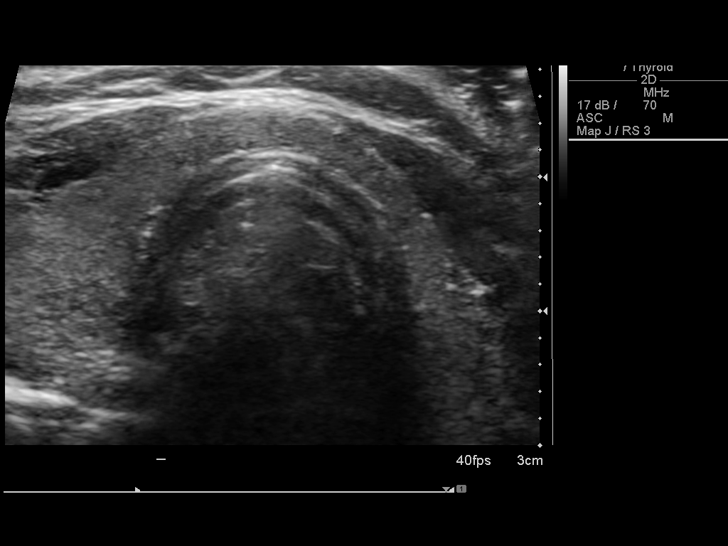
[im 46/79]
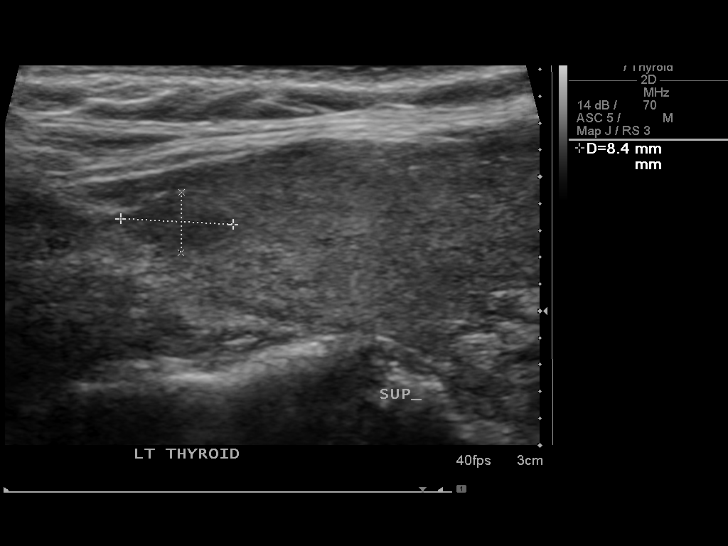
[im 53/79]
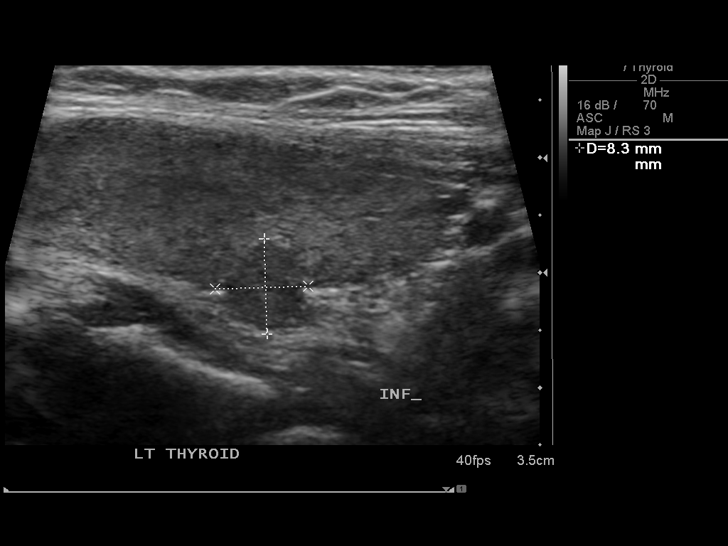
[im 59/79]
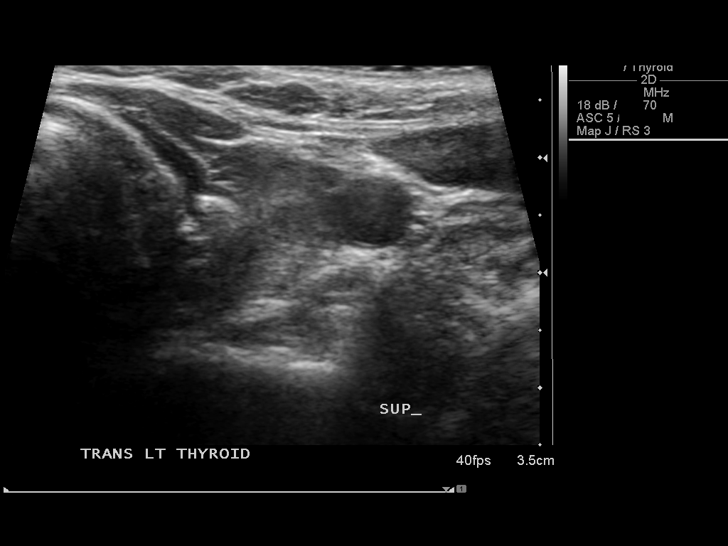
[im 66/79]
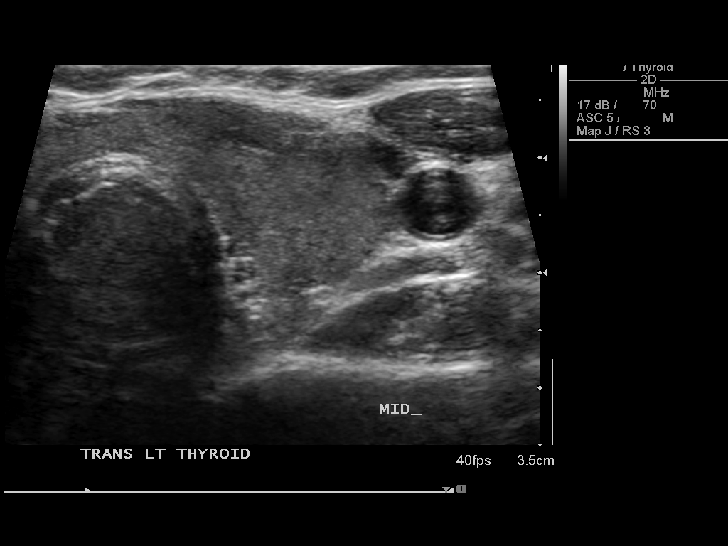
[im 72/79]
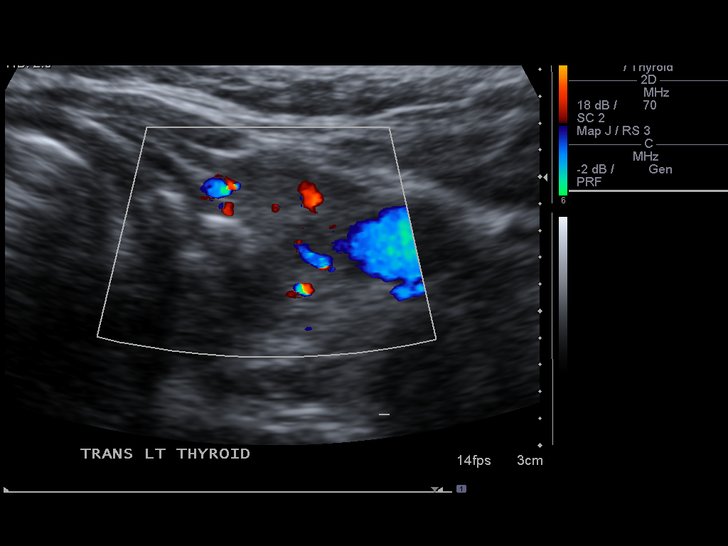
[im 79/79]
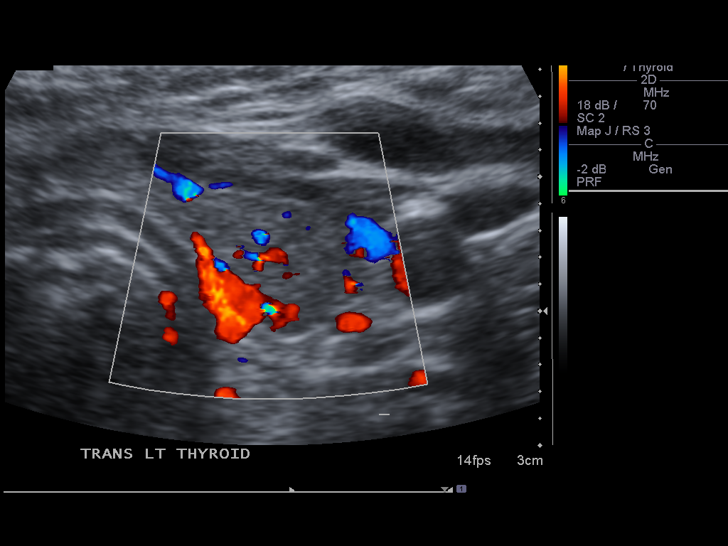

[13 of 25 positions shown; findings below may reference images not displayed]

FINDINGS: Right thyroid lobe

Measurements: 4.8 x 1.7 x 1.7 cm. Tiny 5 mm solid hypoechoic nodule
in the deep aspect of the mid right gland. Diffusely heterogeneous
thyroid parenchyma.

Left thyroid lobe

Measurements: 4.6 x 1.4 x 1.6 cm. Two small solid hypoechoic nodules
measuring up to 8 mm 1 in the upper gland than 1 in the mid gland.
Diffusely heterogeneous thyroid parenchyma. Neither of these nodules
were measured on the prior study.

Isthmus

Thickness: 0.5 cm. Small hypoechoic solid nodule measuring up to 7
mm in the right aspect of the isthmus abutting the right thyroid
lobe. This nodule is significantly less conspicuous than seen on the
prior ultrasound.

Lymphadenopathy

None visualized.
IMPRESSION: Small (less than 1 cm) thyroid nodules bilaterally. There are 2
nodules in the left gland which were not present on the prior study
while the previously noted nodule in the isthmus is significantly
less conspicuous on today's study. Shifting appearance of small
nodular regions may represent active/focal thyroiditis.

Findings do not meet current SRU consensus criteria for biopsy.
Follow-up by clinical exam is recommended. If patient has known risk
factors for thyroid carcinoma, consider follow-up ultrasound in 12
months. If patient is clinically hyperthyroid, consider nuclear
medicine thyroid uptake and scan.Reference: Management of Thyroid
Nodules Detected at US: Society of Radiologists in Ultrasound

## 2015-11-25 DIAGNOSIS — M542 Cervicalgia: Secondary | ICD-10-CM | POA: Diagnosis not present

## 2015-11-25 DIAGNOSIS — M25512 Pain in left shoulder: Secondary | ICD-10-CM | POA: Diagnosis not present

## 2015-11-25 DIAGNOSIS — M545 Low back pain: Secondary | ICD-10-CM | POA: Diagnosis not present

## 2015-11-25 DIAGNOSIS — M25511 Pain in right shoulder: Secondary | ICD-10-CM | POA: Diagnosis not present

## 2015-11-27 DIAGNOSIS — M25512 Pain in left shoulder: Secondary | ICD-10-CM | POA: Diagnosis not present

## 2015-11-27 DIAGNOSIS — M25511 Pain in right shoulder: Secondary | ICD-10-CM | POA: Diagnosis not present

## 2015-11-27 DIAGNOSIS — M545 Low back pain: Secondary | ICD-10-CM | POA: Diagnosis not present

## 2015-11-27 DIAGNOSIS — M542 Cervicalgia: Secondary | ICD-10-CM | POA: Diagnosis not present

## 2015-11-28 ENCOUNTER — Other Ambulatory Visit: Payer: Self-pay | Admitting: Obstetrics and Gynecology

## 2015-11-28 DIAGNOSIS — Z1231 Encounter for screening mammogram for malignant neoplasm of breast: Secondary | ICD-10-CM | POA: Diagnosis not present

## 2015-11-28 DIAGNOSIS — Z01419 Encounter for gynecological examination (general) (routine) without abnormal findings: Secondary | ICD-10-CM | POA: Diagnosis not present

## 2015-11-28 DIAGNOSIS — Z6827 Body mass index (BMI) 27.0-27.9, adult: Secondary | ICD-10-CM | POA: Diagnosis not present

## 2015-11-28 DIAGNOSIS — Z124 Encounter for screening for malignant neoplasm of cervix: Secondary | ICD-10-CM | POA: Diagnosis not present

## 2015-11-29 LAB — CYTOLOGY - PAP

## 2015-12-03 DIAGNOSIS — M25511 Pain in right shoulder: Secondary | ICD-10-CM | POA: Diagnosis not present

## 2015-12-03 DIAGNOSIS — M25512 Pain in left shoulder: Secondary | ICD-10-CM | POA: Diagnosis not present

## 2015-12-03 DIAGNOSIS — M545 Low back pain: Secondary | ICD-10-CM | POA: Diagnosis not present

## 2015-12-03 DIAGNOSIS — M542 Cervicalgia: Secondary | ICD-10-CM | POA: Diagnosis not present

## 2015-12-05 DIAGNOSIS — M25512 Pain in left shoulder: Secondary | ICD-10-CM | POA: Diagnosis not present

## 2015-12-05 DIAGNOSIS — M25511 Pain in right shoulder: Secondary | ICD-10-CM | POA: Diagnosis not present

## 2015-12-05 DIAGNOSIS — M545 Low back pain: Secondary | ICD-10-CM | POA: Diagnosis not present

## 2015-12-05 DIAGNOSIS — M542 Cervicalgia: Secondary | ICD-10-CM | POA: Diagnosis not present

## 2015-12-12 DIAGNOSIS — M25511 Pain in right shoulder: Secondary | ICD-10-CM | POA: Diagnosis not present

## 2015-12-12 DIAGNOSIS — E559 Vitamin D deficiency, unspecified: Secondary | ICD-10-CM | POA: Diagnosis not present

## 2015-12-12 DIAGNOSIS — M545 Low back pain: Secondary | ICD-10-CM | POA: Diagnosis not present

## 2015-12-12 DIAGNOSIS — M25512 Pain in left shoulder: Secondary | ICD-10-CM | POA: Diagnosis not present

## 2015-12-12 DIAGNOSIS — E7800 Pure hypercholesterolemia, unspecified: Secondary | ICD-10-CM | POA: Diagnosis not present

## 2015-12-12 DIAGNOSIS — M542 Cervicalgia: Secondary | ICD-10-CM | POA: Diagnosis not present

## 2015-12-12 DIAGNOSIS — Z0000 Encounter for general adult medical examination without abnormal findings: Secondary | ICD-10-CM | POA: Diagnosis not present

## 2015-12-17 DIAGNOSIS — M25512 Pain in left shoulder: Secondary | ICD-10-CM | POA: Diagnosis not present

## 2015-12-17 DIAGNOSIS — M25511 Pain in right shoulder: Secondary | ICD-10-CM | POA: Diagnosis not present

## 2015-12-17 DIAGNOSIS — M545 Low back pain: Secondary | ICD-10-CM | POA: Diagnosis not present

## 2015-12-17 DIAGNOSIS — M542 Cervicalgia: Secondary | ICD-10-CM | POA: Diagnosis not present

## 2015-12-19 DIAGNOSIS — Z0000 Encounter for general adult medical examination without abnormal findings: Secondary | ICD-10-CM | POA: Diagnosis not present

## 2015-12-19 DIAGNOSIS — E049 Nontoxic goiter, unspecified: Secondary | ICD-10-CM | POA: Diagnosis not present

## 2015-12-19 DIAGNOSIS — E663 Overweight: Secondary | ICD-10-CM | POA: Diagnosis not present

## 2015-12-19 DIAGNOSIS — L820 Inflamed seborrheic keratosis: Secondary | ICD-10-CM | POA: Diagnosis not present

## 2015-12-23 ENCOUNTER — Other Ambulatory Visit: Payer: Self-pay | Admitting: Internal Medicine

## 2015-12-23 DIAGNOSIS — M25511 Pain in right shoulder: Secondary | ICD-10-CM | POA: Diagnosis not present

## 2015-12-23 DIAGNOSIS — E049 Nontoxic goiter, unspecified: Secondary | ICD-10-CM

## 2015-12-23 DIAGNOSIS — M542 Cervicalgia: Secondary | ICD-10-CM | POA: Diagnosis not present

## 2015-12-23 DIAGNOSIS — M545 Low back pain: Secondary | ICD-10-CM | POA: Diagnosis not present

## 2015-12-23 DIAGNOSIS — M25512 Pain in left shoulder: Secondary | ICD-10-CM | POA: Diagnosis not present

## 2015-12-26 DIAGNOSIS — M542 Cervicalgia: Secondary | ICD-10-CM | POA: Diagnosis not present

## 2015-12-26 DIAGNOSIS — M545 Low back pain: Secondary | ICD-10-CM | POA: Diagnosis not present

## 2015-12-26 DIAGNOSIS — M25511 Pain in right shoulder: Secondary | ICD-10-CM | POA: Diagnosis not present

## 2015-12-26 DIAGNOSIS — M25512 Pain in left shoulder: Secondary | ICD-10-CM | POA: Diagnosis not present

## 2016-01-15 ENCOUNTER — Other Ambulatory Visit: Payer: Self-pay

## 2016-01-17 ENCOUNTER — Ambulatory Visit
Admission: RE | Admit: 2016-01-17 | Discharge: 2016-01-17 | Disposition: A | Discharge status: Home / Self Care | Payer: BLUE CROSS/BLUE SHIELD | Source: Ambulatory Visit | Attending: Internal Medicine | Admitting: Internal Medicine

## 2016-01-17 DIAGNOSIS — E042 Nontoxic multinodular goiter: Secondary | ICD-10-CM | POA: Diagnosis not present

## 2016-01-17 DIAGNOSIS — E049 Nontoxic goiter, unspecified: Secondary | ICD-10-CM

## 2016-01-30 DIAGNOSIS — J329 Chronic sinusitis, unspecified: Secondary | ICD-10-CM | POA: Diagnosis not present

## 2016-05-20 DIAGNOSIS — Z23 Encounter for immunization: Secondary | ICD-10-CM | POA: Diagnosis not present

## 2016-06-20 DIAGNOSIS — S0083XA Contusion of other part of head, initial encounter: Secondary | ICD-10-CM | POA: Diagnosis not present

## 2016-11-30 ENCOUNTER — Other Ambulatory Visit: Payer: Self-pay | Admitting: Obstetrics and Gynecology

## 2016-11-30 DIAGNOSIS — Z6827 Body mass index (BMI) 27.0-27.9, adult: Secondary | ICD-10-CM | POA: Diagnosis not present

## 2016-11-30 DIAGNOSIS — Z01419 Encounter for gynecological examination (general) (routine) without abnormal findings: Secondary | ICD-10-CM | POA: Diagnosis not present

## 2016-11-30 DIAGNOSIS — Z124 Encounter for screening for malignant neoplasm of cervix: Secondary | ICD-10-CM | POA: Diagnosis not present

## 2016-11-30 DIAGNOSIS — Z1231 Encounter for screening mammogram for malignant neoplasm of breast: Secondary | ICD-10-CM | POA: Diagnosis not present

## 2016-12-02 LAB — CYTOLOGY - PAP

## 2016-12-21 DIAGNOSIS — Z0000 Encounter for general adult medical examination without abnormal findings: Secondary | ICD-10-CM | POA: Diagnosis not present

## 2016-12-21 DIAGNOSIS — E559 Vitamin D deficiency, unspecified: Secondary | ICD-10-CM | POA: Diagnosis not present

## 2016-12-21 DIAGNOSIS — E039 Hypothyroidism, unspecified: Secondary | ICD-10-CM | POA: Diagnosis not present

## 2016-12-23 DIAGNOSIS — E559 Vitamin D deficiency, unspecified: Secondary | ICD-10-CM | POA: Diagnosis not present

## 2016-12-23 DIAGNOSIS — Z0000 Encounter for general adult medical examination without abnormal findings: Secondary | ICD-10-CM | POA: Diagnosis not present

## 2017-01-28 DIAGNOSIS — D485 Neoplasm of uncertain behavior of skin: Secondary | ICD-10-CM | POA: Diagnosis not present

## 2017-01-28 DIAGNOSIS — D225 Melanocytic nevi of trunk: Secondary | ICD-10-CM | POA: Diagnosis not present

## 2017-01-28 DIAGNOSIS — R21 Rash and other nonspecific skin eruption: Secondary | ICD-10-CM | POA: Diagnosis not present

## 2017-02-23 DIAGNOSIS — L905 Scar conditions and fibrosis of skin: Secondary | ICD-10-CM | POA: Diagnosis not present

## 2017-04-29 IMAGING — US US THYROID
1 series · 14 of 25 positions shown · non-contrast
Comparison: None.

CLINICAL DATA: Nodules

EXAM:
THYROID ULTRASOUND
TECHNIQUE: Ultrasound examination of the thyroid gland and adjacent soft
tissues was performed.

[Series 1: us thyroid · 0.06mm/px · 14 of 60 slices shown]
[im 1/60]
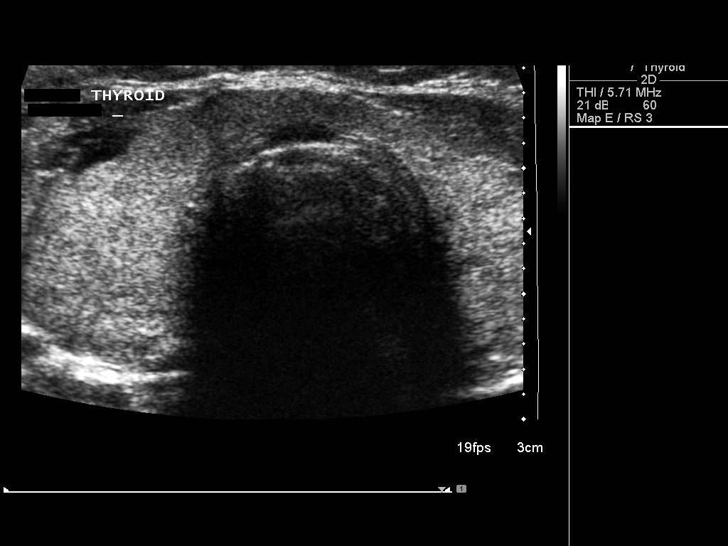
[im 5/60]
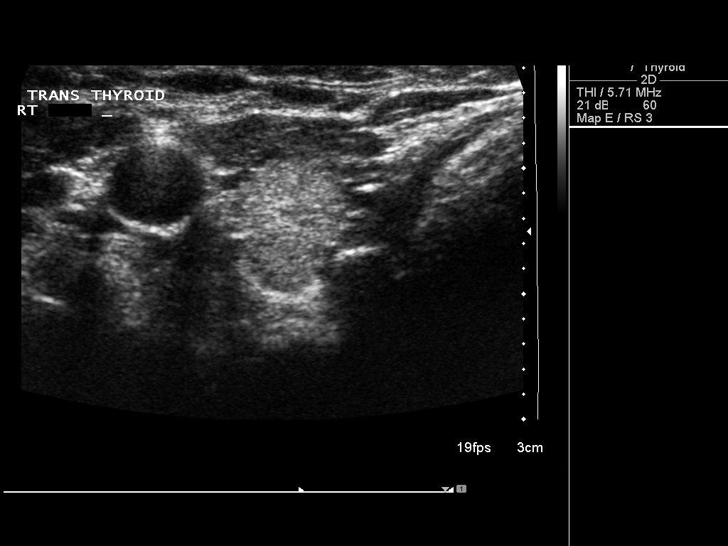
[im 10/60]
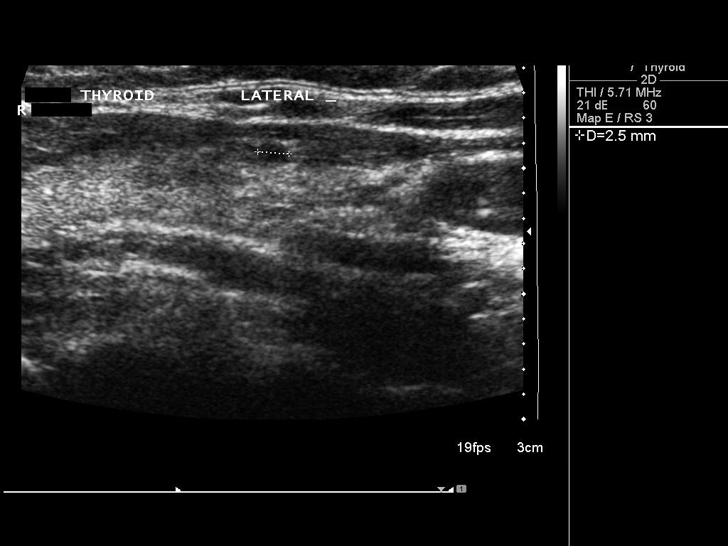
[im 15/60]
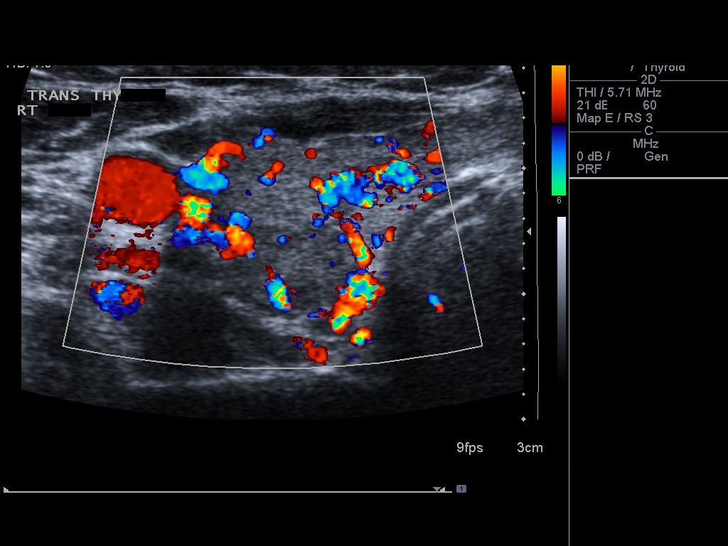
[im 20/60]
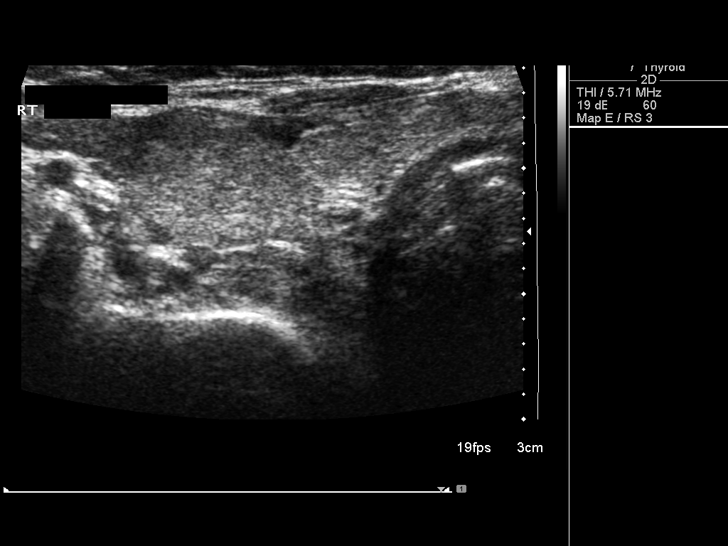
[im 23/60]
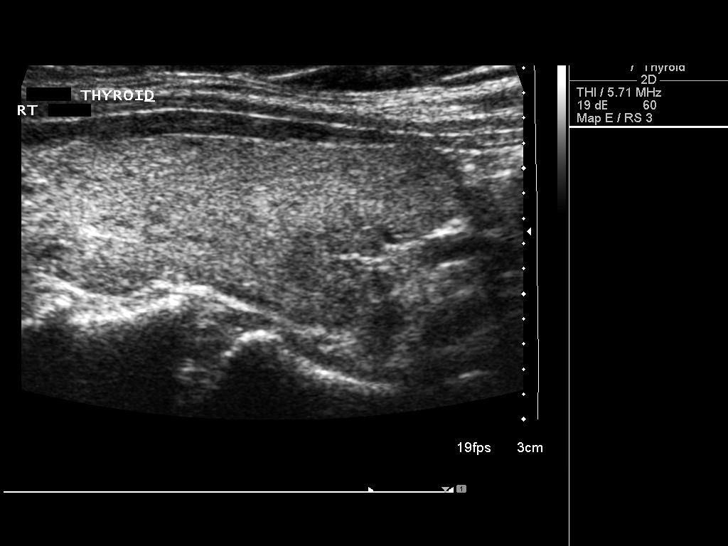
[im 28/60]
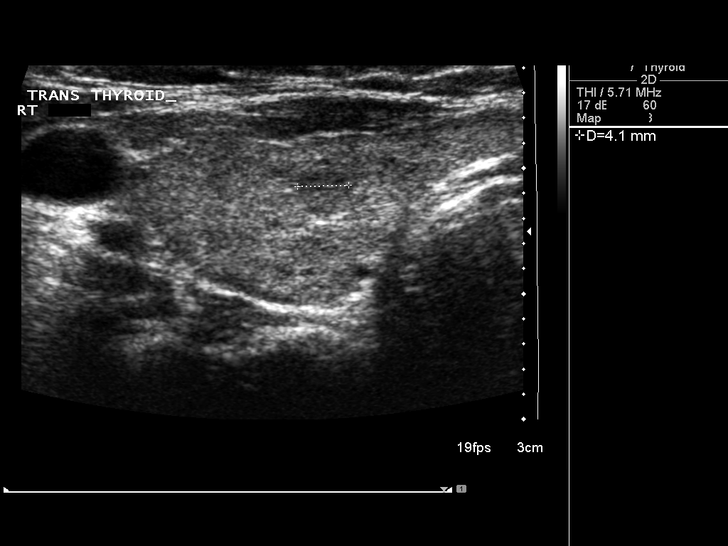
[im 32/60]
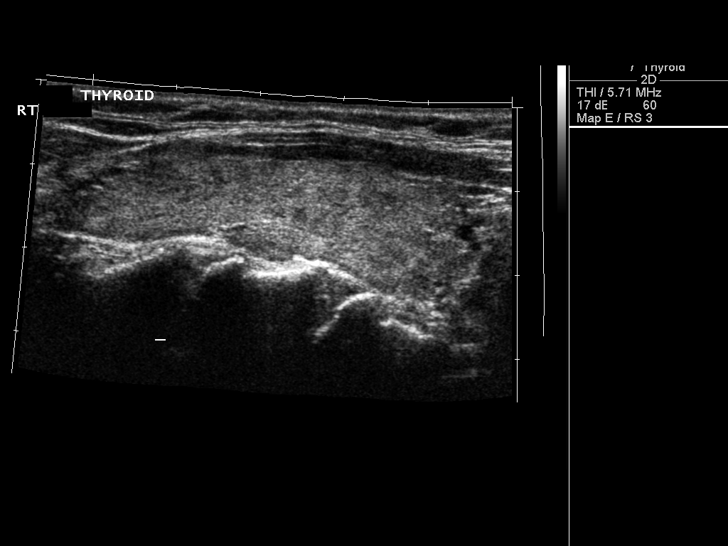
[im 37/60]
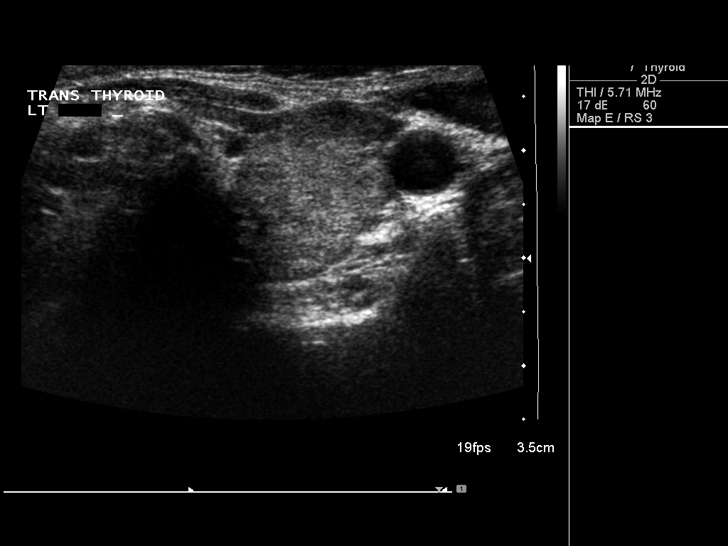
[im 40/60]
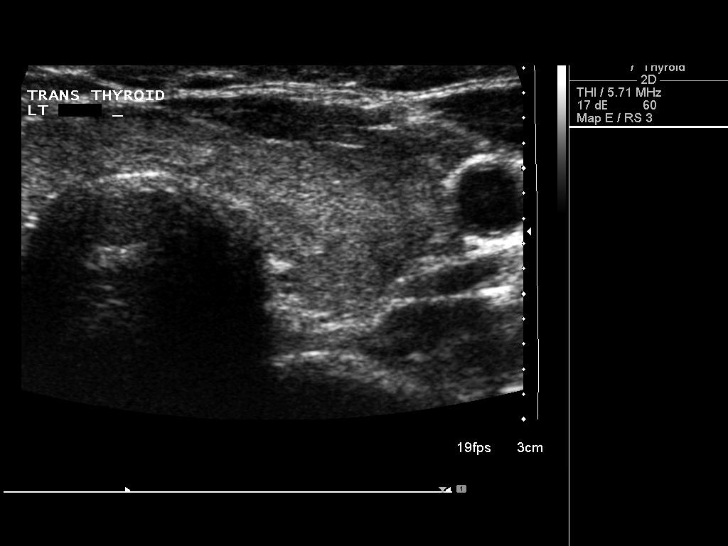
[im 45/60]
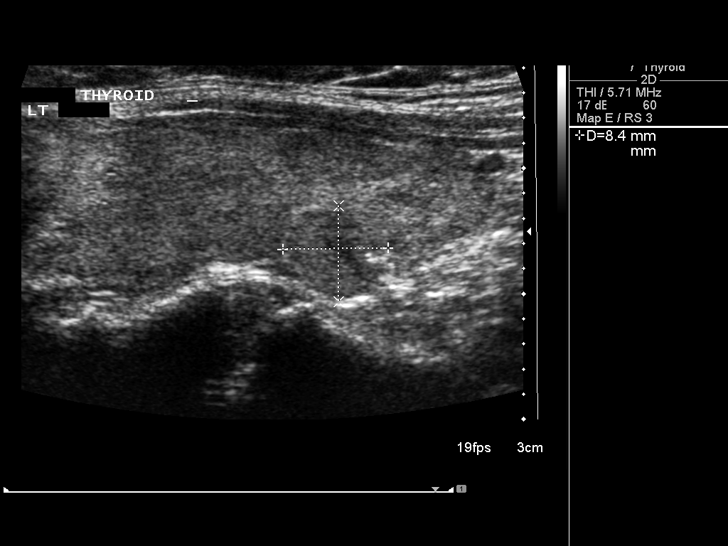
[im 50/60]
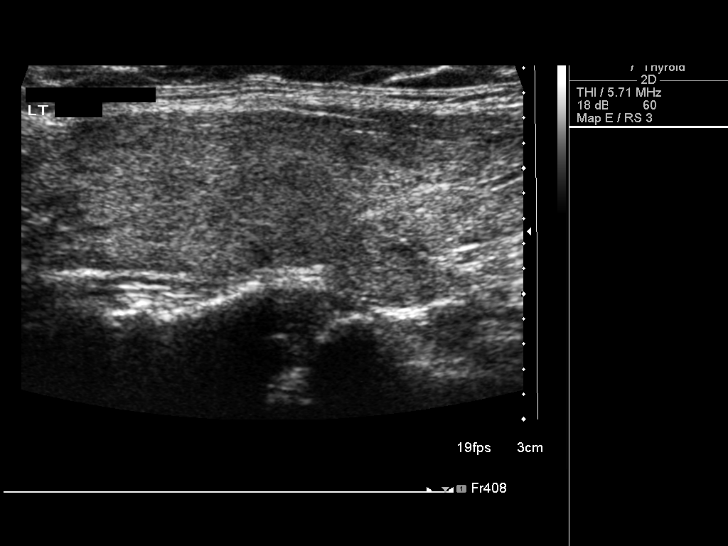
[im 55/60]
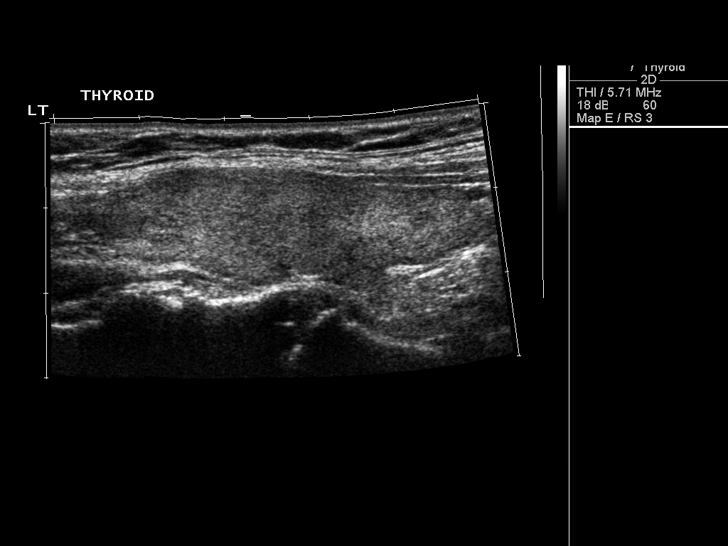
[im 60/60]
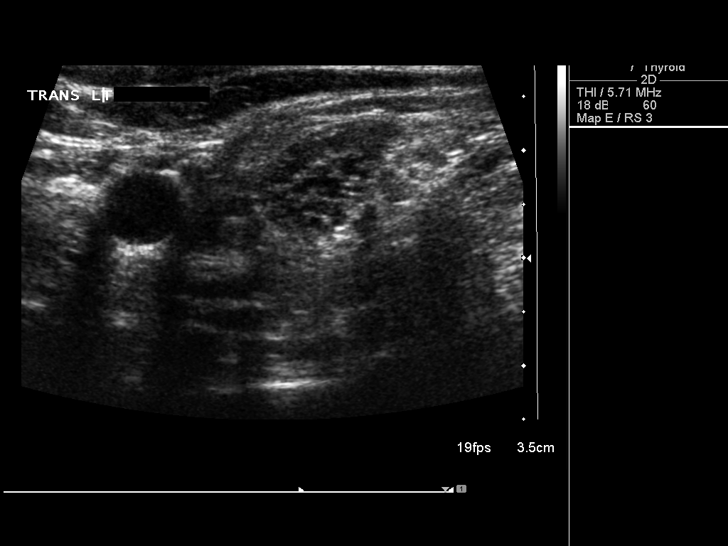

[14 of 25 positions shown; findings below may reference images not displayed]

FINDINGS: Right thyroid lobe

Measurements: 4.9 x 1.4 x 1.9 cm. Mildly inhomogeneous background
parenchyma with mild hyperemia. 3 mm cyst, superior pole. 5 x 3 x 4
mm hypoechoic nodule, inferior pole.

Left thyroid lobe

Measurements: 4.8 x 1.4 x 1.7 cm. 8 mm nearly isoechoic nodule
without microcalcifications, inferior pole

Isthmus

Thickness: 0.4 cm.  No nodules visualized.

Lymphadenopathy

None visualized.
IMPRESSION: 1. Normal-sized thyroid with small bilateral nodules. Findings do
not meet current consensus criteria for biopsy. Follow-up by
clinical exam is recommended. If patient has known risk factors for
thyroid carcinoma, consider follow-up ultrasound in 12 months. If
patient is clinically hyperthyroid, consider nuclear medicine
thyroid uptake and scan. This recommendation follows the consensus
statement: Management of Thyroid Nodules Detected as US: Society of
Radiologists in Ultrasound Consensus Conference Statement. Radiology

## 2017-07-06 DIAGNOSIS — R0789 Other chest pain: Secondary | ICD-10-CM | POA: Diagnosis not present

## 2017-07-06 DIAGNOSIS — K30 Functional dyspepsia: Secondary | ICD-10-CM | POA: Diagnosis not present

## 2017-08-15 DIAGNOSIS — R05 Cough: Secondary | ICD-10-CM | POA: Diagnosis not present

## 2017-08-15 DIAGNOSIS — J029 Acute pharyngitis, unspecified: Secondary | ICD-10-CM | POA: Diagnosis not present

## 2017-08-15 DIAGNOSIS — J22 Unspecified acute lower respiratory infection: Secondary | ICD-10-CM | POA: Diagnosis not present

## 2017-09-10 DIAGNOSIS — R221 Localized swelling, mass and lump, neck: Secondary | ICD-10-CM | POA: Diagnosis not present

## 2017-09-13 DIAGNOSIS — E042 Nontoxic multinodular goiter: Secondary | ICD-10-CM | POA: Diagnosis not present

## 2017-11-10 DIAGNOSIS — S92355B Nondisplaced fracture of fifth metatarsal bone, left foot, initial encounter for open fracture: Secondary | ICD-10-CM | POA: Diagnosis not present

## 2017-11-10 DIAGNOSIS — M25532 Pain in left wrist: Secondary | ICD-10-CM | POA: Diagnosis not present

## 2017-11-10 DIAGNOSIS — S53401A Unspecified sprain of right elbow, initial encounter: Secondary | ICD-10-CM | POA: Diagnosis not present

## 2017-11-10 DIAGNOSIS — M79601 Pain in right arm: Secondary | ICD-10-CM | POA: Diagnosis not present

## 2017-11-10 DIAGNOSIS — M25572 Pain in left ankle and joints of left foot: Secondary | ICD-10-CM | POA: Diagnosis not present

## 2017-11-10 DIAGNOSIS — S92355A Nondisplaced fracture of fifth metatarsal bone, left foot, initial encounter for closed fracture: Secondary | ICD-10-CM | POA: Diagnosis not present

## 2017-12-01 DIAGNOSIS — S92355D Nondisplaced fracture of fifth metatarsal bone, left foot, subsequent encounter for fracture with routine healing: Secondary | ICD-10-CM | POA: Diagnosis not present

## 2017-12-22 DIAGNOSIS — E559 Vitamin D deficiency, unspecified: Secondary | ICD-10-CM | POA: Diagnosis not present

## 2017-12-22 DIAGNOSIS — E7800 Pure hypercholesterolemia, unspecified: Secondary | ICD-10-CM | POA: Diagnosis not present

## 2017-12-22 DIAGNOSIS — I10 Essential (primary) hypertension: Secondary | ICD-10-CM | POA: Diagnosis not present

## 2017-12-22 DIAGNOSIS — E039 Hypothyroidism, unspecified: Secondary | ICD-10-CM | POA: Diagnosis not present

## 2017-12-28 DIAGNOSIS — E559 Vitamin D deficiency, unspecified: Secondary | ICD-10-CM | POA: Diagnosis not present

## 2017-12-28 DIAGNOSIS — Z780 Asymptomatic menopausal state: Secondary | ICD-10-CM | POA: Diagnosis not present

## 2017-12-28 DIAGNOSIS — R319 Hematuria, unspecified: Secondary | ICD-10-CM | POA: Diagnosis not present

## 2017-12-28 DIAGNOSIS — Z0000 Encounter for general adult medical examination without abnormal findings: Secondary | ICD-10-CM | POA: Diagnosis not present

## 2017-12-29 DIAGNOSIS — Z1231 Encounter for screening mammogram for malignant neoplasm of breast: Secondary | ICD-10-CM | POA: Diagnosis not present

## 2017-12-29 DIAGNOSIS — Z6827 Body mass index (BMI) 27.0-27.9, adult: Secondary | ICD-10-CM | POA: Diagnosis not present

## 2017-12-29 DIAGNOSIS — Z124 Encounter for screening for malignant neoplasm of cervix: Secondary | ICD-10-CM | POA: Diagnosis not present

## 2017-12-29 DIAGNOSIS — Z01419 Encounter for gynecological examination (general) (routine) without abnormal findings: Secondary | ICD-10-CM | POA: Diagnosis not present

## 2018-01-11 DIAGNOSIS — H903 Sensorineural hearing loss, bilateral: Secondary | ICD-10-CM | POA: Diagnosis not present

## 2018-03-04 DIAGNOSIS — Z1211 Encounter for screening for malignant neoplasm of colon: Secondary | ICD-10-CM | POA: Diagnosis not present

## 2018-03-04 DIAGNOSIS — K648 Other hemorrhoids: Secondary | ICD-10-CM | POA: Diagnosis not present

## 2018-03-04 DIAGNOSIS — K5730 Diverticulosis of large intestine without perforation or abscess without bleeding: Secondary | ICD-10-CM | POA: Diagnosis not present

## 2018-05-16 DIAGNOSIS — Z23 Encounter for immunization: Secondary | ICD-10-CM | POA: Diagnosis not present

## 2018-07-27 DIAGNOSIS — J069 Acute upper respiratory infection, unspecified: Secondary | ICD-10-CM | POA: Diagnosis not present

## 2018-07-27 DIAGNOSIS — B9789 Other viral agents as the cause of diseases classified elsewhere: Secondary | ICD-10-CM | POA: Diagnosis not present

## 2018-08-09 DIAGNOSIS — I10 Essential (primary) hypertension: Secondary | ICD-10-CM | POA: Diagnosis not present

## 2018-08-09 DIAGNOSIS — R05 Cough: Secondary | ICD-10-CM | POA: Diagnosis not present

## 2018-09-09 ENCOUNTER — Other Ambulatory Visit: Payer: Self-pay | Admitting: Internal Medicine

## 2018-09-14 ENCOUNTER — Other Ambulatory Visit: Payer: Self-pay | Admitting: Internal Medicine

## 2018-09-15 ENCOUNTER — Other Ambulatory Visit: Payer: Self-pay | Admitting: Internal Medicine

## 2018-09-15 DIAGNOSIS — E042 Nontoxic multinodular goiter: Secondary | ICD-10-CM

## 2018-09-16 ENCOUNTER — Ambulatory Visit
Admission: RE | Admit: 2018-09-16 | Discharge: 2018-09-16 | Disposition: A | Discharge status: Home / Self Care | Payer: BLUE CROSS/BLUE SHIELD | Source: Ambulatory Visit | Attending: Internal Medicine | Admitting: Internal Medicine

## 2018-09-16 DIAGNOSIS — E042 Nontoxic multinodular goiter: Secondary | ICD-10-CM

## 2018-09-23 DIAGNOSIS — E559 Vitamin D deficiency, unspecified: Secondary | ICD-10-CM | POA: Diagnosis not present

## 2018-09-23 DIAGNOSIS — H10413 Chronic giant papillary conjunctivitis, bilateral: Secondary | ICD-10-CM | POA: Diagnosis not present

## 2018-09-23 DIAGNOSIS — E039 Hypothyroidism, unspecified: Secondary | ICD-10-CM | POA: Diagnosis not present

## 2018-09-29 DIAGNOSIS — E042 Nontoxic multinodular goiter: Secondary | ICD-10-CM | POA: Diagnosis not present

## 2018-09-29 DIAGNOSIS — Z6828 Body mass index (BMI) 28.0-28.9, adult: Secondary | ICD-10-CM | POA: Diagnosis not present

## 2018-11-10 DIAGNOSIS — E042 Nontoxic multinodular goiter: Secondary | ICD-10-CM | POA: Diagnosis not present

## 2018-12-28 DIAGNOSIS — E039 Hypothyroidism, unspecified: Secondary | ICD-10-CM | POA: Diagnosis not present

## 2018-12-28 DIAGNOSIS — Z0000 Encounter for general adult medical examination without abnormal findings: Secondary | ICD-10-CM | POA: Diagnosis not present

## 2018-12-28 DIAGNOSIS — I10 Essential (primary) hypertension: Secondary | ICD-10-CM | POA: Diagnosis not present

## 2018-12-28 DIAGNOSIS — E559 Vitamin D deficiency, unspecified: Secondary | ICD-10-CM | POA: Diagnosis not present

## 2019-01-05 DIAGNOSIS — Z0000 Encounter for general adult medical examination without abnormal findings: Secondary | ICD-10-CM | POA: Diagnosis not present

## 2019-03-30 DIAGNOSIS — Z124 Encounter for screening for malignant neoplasm of cervix: Secondary | ICD-10-CM | POA: Diagnosis not present

## 2019-03-30 DIAGNOSIS — Z1231 Encounter for screening mammogram for malignant neoplasm of breast: Secondary | ICD-10-CM | POA: Diagnosis not present

## 2019-03-30 DIAGNOSIS — Z01419 Encounter for gynecological examination (general) (routine) without abnormal findings: Secondary | ICD-10-CM | POA: Diagnosis not present

## 2019-03-30 DIAGNOSIS — Z6828 Body mass index (BMI) 28.0-28.9, adult: Secondary | ICD-10-CM | POA: Diagnosis not present

## 2019-06-20 DIAGNOSIS — R0789 Other chest pain: Secondary | ICD-10-CM | POA: Diagnosis not present

## 2019-06-21 ENCOUNTER — Other Ambulatory Visit: Payer: Self-pay

## 2019-06-21 DIAGNOSIS — Z20822 Contact with and (suspected) exposure to covid-19: Secondary | ICD-10-CM

## 2019-06-22 LAB — NOVEL CORONAVIRUS, NAA: SARS-CoV-2, NAA: NOT DETECTED

## 2019-07-16 IMAGING — US US THYROID
1 series · 13 of 25 positions shown · non-contrast
Comparison: 01/17/2016

CLINICAL DATA: Prior ultrasound follow-up.

EXAM:
THYROID ULTRASOUND
TECHNIQUE: Ultrasound examination of the thyroid gland and adjacent soft
tissues was performed.

[Series 1: us thyroid · 0.06mm/px · 13 of 66 slices shown]
[im 1/66]
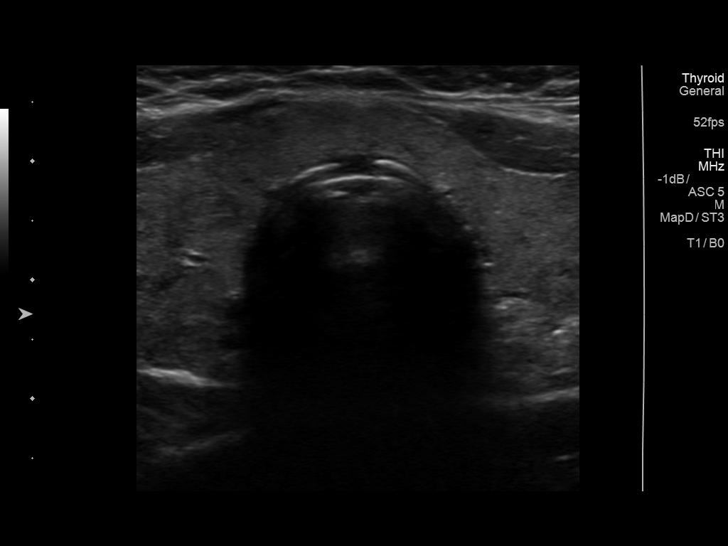
[im 6/66]
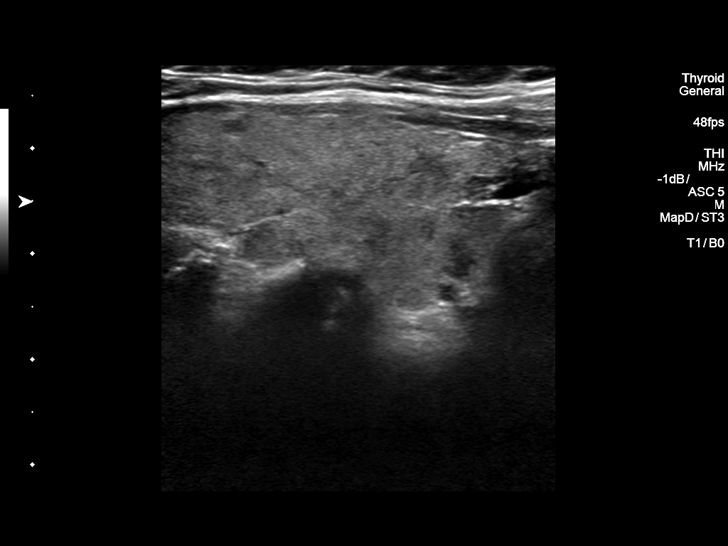
[im 11/66]
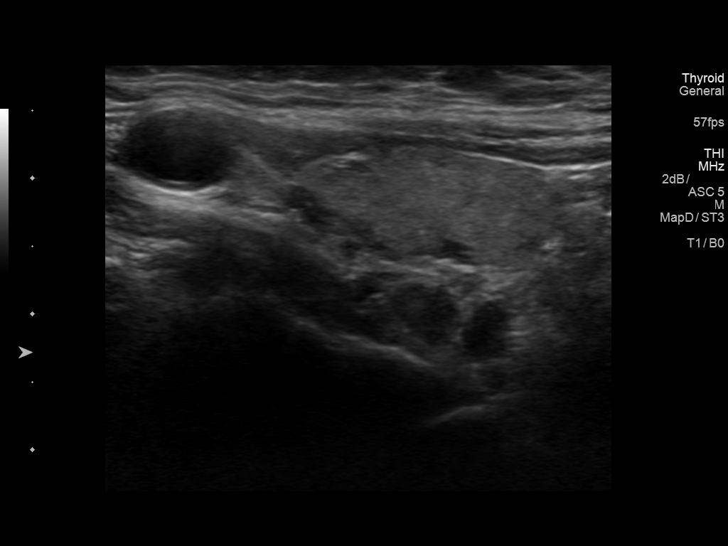
[im 17/66]
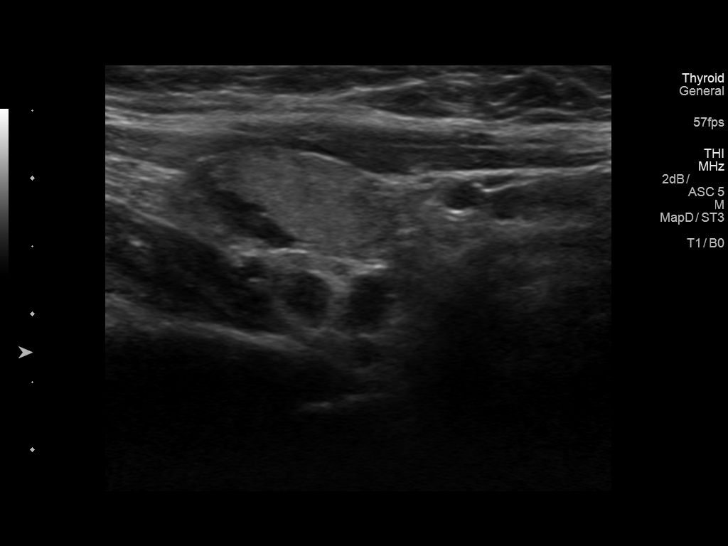
[im 22/66]
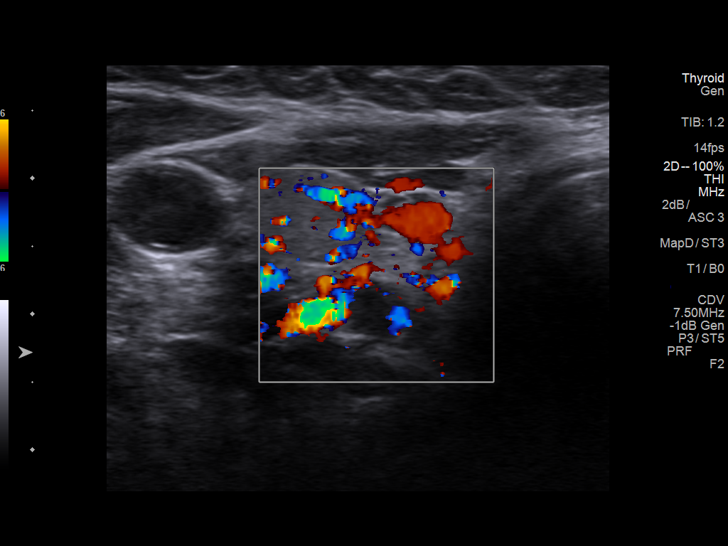
[im 28/66]
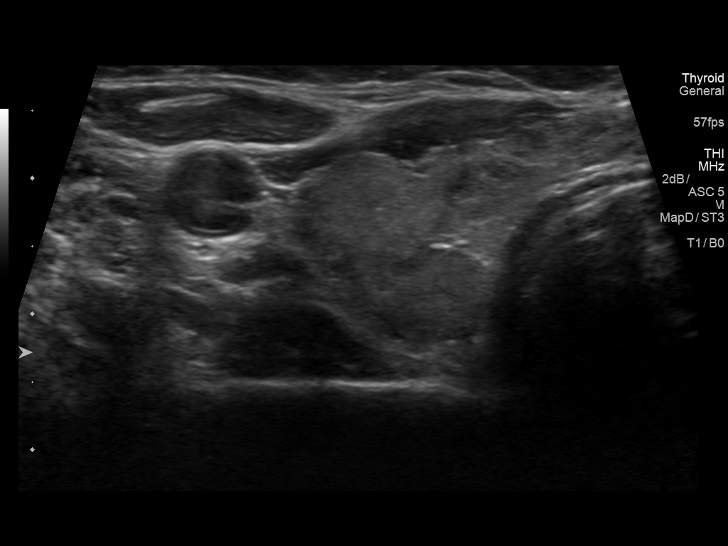
[im 33/66]
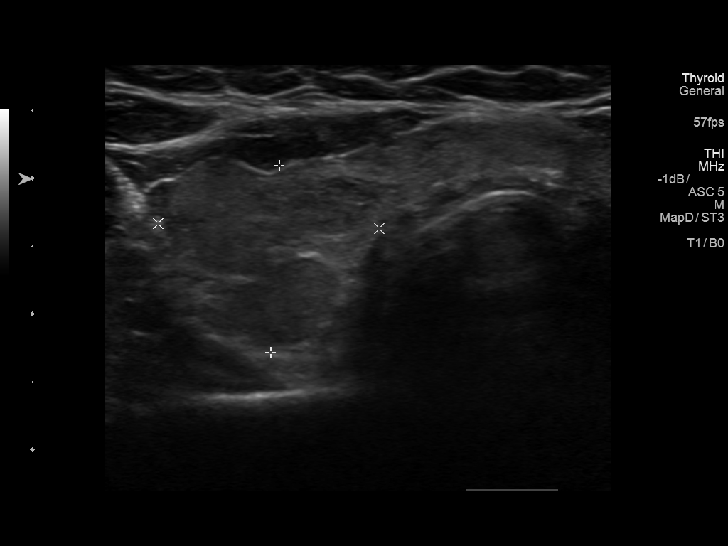
[im 38/66]
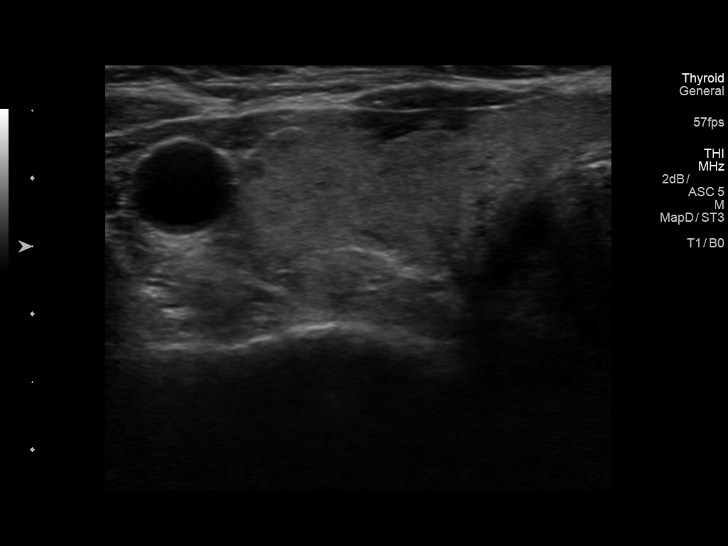
[im 44/66]
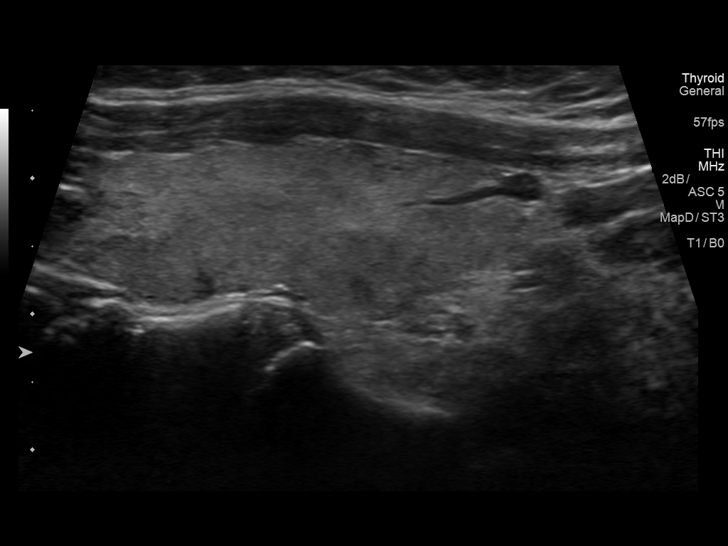
[im 49/66]
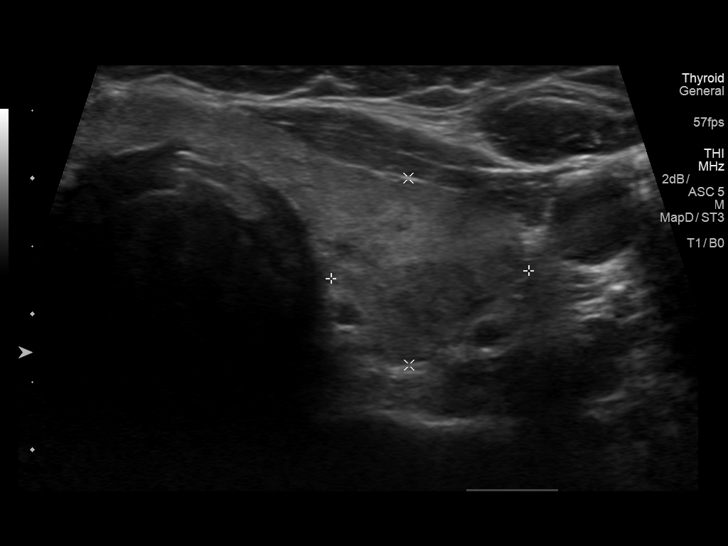
[im 55/66]
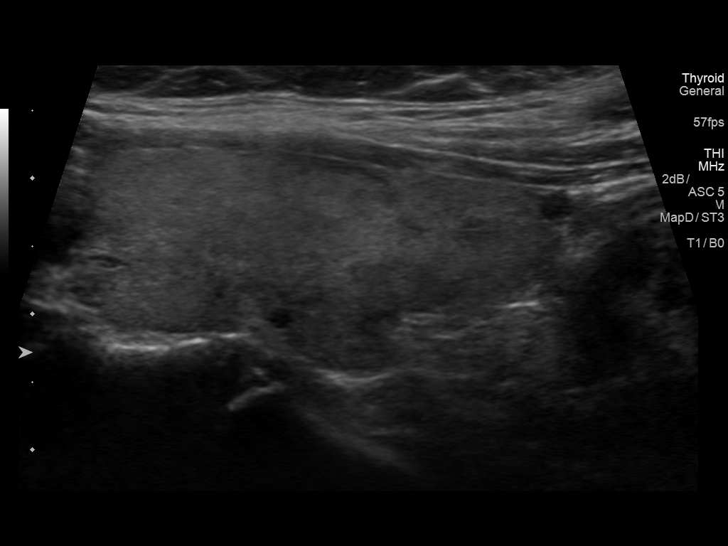
[im 60/66]
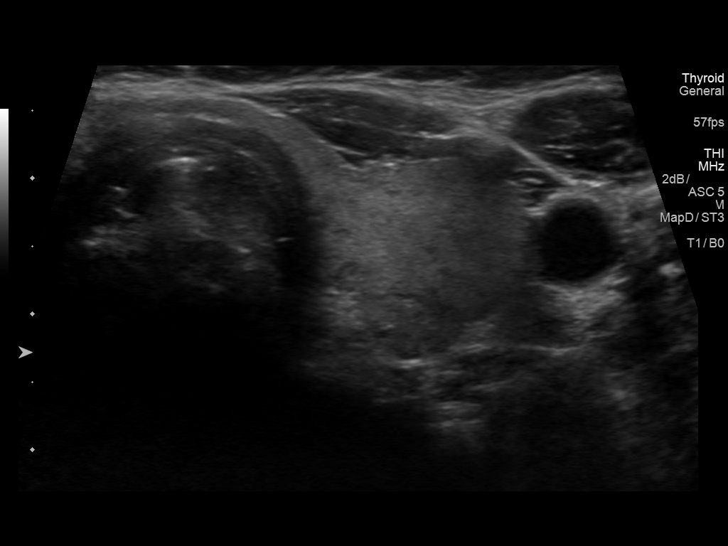
[im 66/66]
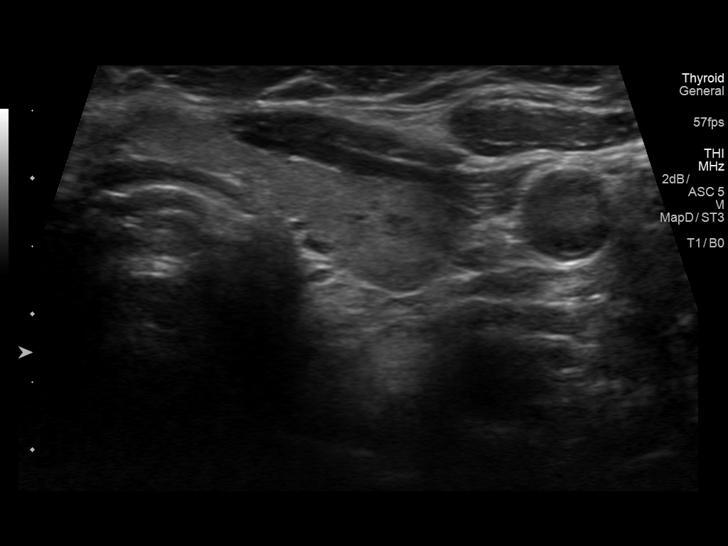

[13 of 25 positions shown; findings below may reference images not displayed]

FINDINGS: Parenchymal Echotexture: Moderately heterogenous

Isthmus: 0.4 cm, previously 0.4 cm

Right lobe: 5.2 x 1.4 x 2.0 cm, previously 4.9 x 1.4 x 1.9 cm

Left lobe: 4.2 x 1.4 x 1.5 cm, previously 4.8 x 1.4 x 1.7 cm

_________________________________________________________

Estimated total number of nodules >/= 1 cm: 1

Number of spongiform nodules >/=  2 cm not described below (TR1): 0

Number of mixed cystic and solid nodules >/= 1.5 cm not described
below (TR2): 0

_________________________________________________________

Nodule # 1:

Prior biopsy: No

Location: Right; Superior

Maximum size: 1.3 cm; Other 2 dimensions: 0.6 x 0.4 cm, previously,
0.5 cm

Composition: solid/almost completely solid (2)

Echogenicity: hypoechoic (2)

Shape: not taller-than-wide (0)

Margins: smooth (0)

Echogenic foci: none (0)

ACR TI-RADS total points: 4.

ACR TI-RADS risk category:  TR4 (4-6 points).

Significant change in size (>/= 20% in two dimensions and minimal
increase of 2 mm): Yes

Change in features: No

Change in ACR TI-RADS risk category: No

ACR TI-RADS recommendations:

*Given size (>/= 1 - 1.4 cm) and appearance, a follow-up ultrasound
in 1 year should be considered based on TI-RADS criteria.

_________________________________________________________

0.8 cm left lobe nodule 2 is stable and does not meet criteria for
biopsy nor follow-up.

There is a lobulated heterogeneous and hypoechoic nodule measuring
0.5 cm that is inferior and posterior to the right lobe of the
thyroid gland.
IMPRESSION: Right nodule 1 has enlarged and now meets criteria for annual
follow-up

Left lobe nodule 2 does not meet criteria for biopsy nor follow-up
and is stable.

There is a 0.5 cm heterogeneous nodule posterior to the lower pole
of the right lobe. This is nonspecific. This may represent a
parathyroid origin mass. Correlate clinically as for the need for
further imaging.

The above is in keeping with the ACR TI-RADS recommendations - [HOSPITAL] 2738;[DATE].

## 2019-07-31 ENCOUNTER — Other Ambulatory Visit: Payer: Self-pay

## 2019-07-31 ENCOUNTER — Other Ambulatory Visit: Payer: Self-pay | Admitting: Internal Medicine

## 2019-07-31 ENCOUNTER — Ambulatory Visit: Payer: BLUE CROSS/BLUE SHIELD | Admitting: Cardiology

## 2019-07-31 ENCOUNTER — Encounter: Payer: Self-pay | Admitting: Cardiology

## 2019-07-31 VITALS — BP 135/85 | HR 68 | Temp 98.4°F | Ht 66.0 in | Wt 179.0 lb

## 2019-07-31 DIAGNOSIS — R0789 Other chest pain: Secondary | ICD-10-CM

## 2019-07-31 DIAGNOSIS — R0600 Dyspnea, unspecified: Secondary | ICD-10-CM

## 2019-07-31 DIAGNOSIS — E7800 Pure hypercholesterolemia, unspecified: Secondary | ICD-10-CM | POA: Diagnosis not present

## 2019-07-31 DIAGNOSIS — E042 Nontoxic multinodular goiter: Secondary | ICD-10-CM

## 2019-07-31 DIAGNOSIS — R0609 Other forms of dyspnea: Secondary | ICD-10-CM | POA: Diagnosis not present

## 2019-07-31 DIAGNOSIS — R0989 Other specified symptoms and signs involving the circulatory and respiratory systems: Secondary | ICD-10-CM

## 2019-07-31 MED ORDER — AMLODIPINE BESYLATE 2.5 MG PO TABS
2.5000 mg | ORAL_TABLET | Freq: Every day | ORAL | 2 refills | Status: DC
Start: 2019-07-31 — End: 2019-09-01

## 2019-07-31 NOTE — Progress Notes (Signed)
Primary Physician/Referring:  Deland Pretty, MD  Patient ID: Erica Hamilton, female    DOB: 12-26-1960, 58 y.o.   MRN: [id number]  Chief Complaint  Patient presents with  . New Patient (Initial Visit)  . Chest Pain   HPI:    Erica Hamilton  is a 58 y.o. With no significant prior cardiovascular history, mild hyperlipidemia, no history of tobacco use disorder or family history, referred to me for evaluation of chest pain that started about 6 weeks ago.  Chest pain described as tightness in the middle of the chest.  It is present continuously, but occasionally does get worse especially at night.  There is no radiation of chest pain.  Erica Hamilton is also noticed dyspnea on exertion when Erica Hamilton climbs a flight of stairs in her home and this is new.  No other associated palpitations, dizziness or syncope.  No leg edema.  Over the past few months he has also gained a few pounds due to Covid 19 and social distancing.  Erica Hamilton does have history of GERD but states that this is under control with Nexium.  Past Medical History:  Diagnosis Date  . Hypothyroidism    Past Surgical History:  Procedure Laterality Date  . ABLATION     12 yrs ago  . APPENDECTOMY     age 28   Social History   Socioeconomic History  . Marital status: Divorced    Spouse name: Not on file  . Number of children: 0  . Years of education: Not on file  . Highest education level: Not on file  Occupational History  . Not on file  Social Needs  . Financial resource strain: Not on file  . Food insecurity    Worry: Not on file    Inability: Not on file  . Transportation needs    Medical: Not on file    Non-medical: Not on file  Tobacco Use  . Smoking status: Former Research scientist (life sciences)  . Smokeless tobacco: Never Used  . Tobacco comment: smoked from age [age]  Substance and Sexual Activity  . Alcohol use: Yes    Comment: socially  . Drug use: No  . Sexual activity: Not on file  Lifestyle  . Physical activity    Days per week: Not on  file    Minutes per session: Not on file  . Stress: Not on file  Relationships  . Social Herbalist on phone: Not on file    Gets together: Not on file    Attends religious service: Not on file    Active member of club or organization: Not on file    Attends meetings of clubs or organizations: Not on file    Relationship status: Not on file  . Intimate partner violence    Fear of current or ex partner: Not on file    Emotionally abused: Not on file    Physically abused: Not on file    Forced sexual activity: Not on file  Other Topics Concern  . Not on file  Social History Narrative  . Not on file   ROS  Review of Systems  Constitution: Positive for weight gain. Negative for chills, decreased appetite and malaise/fatigue.  Cardiovascular: Positive for chest pain and dyspnea on exertion. Negative for leg swelling and syncope.  Endocrine: Negative for cold intolerance.  Hematologic/Lymphatic: Does not bruise/bleed easily.  Musculoskeletal: Negative for joint swelling.  Gastrointestinal: Positive for heartburn. Negative for abdominal pain, anorexia, change in bowel habit,  hematochezia and melena.  Neurological: Negative for headaches and light-headedness.  Psychiatric/Behavioral: Negative for depression and substance abuse.  All other systems reviewed and are negative.  Objective   Vitals with BMI 07/31/2019 07/31/2019 09/16/2012  Height - 5' 6"  -  Weight - 179 lbs -  BMI - 43.32 -  Systolic 951 884 166  Diastolic 85 063 72  Pulse - 68 65      Physical Exam  Constitutional:  Erica Hamilton is moderately built and well-nourished in no acute distress.  HENT:  Head: Atraumatic.  Eyes: Conjunctivae are normal.  Neck: Neck supple. No JVD present. No thyromegaly present.  Cardiovascular: Normal rate, regular rhythm, normal heart sounds and intact distal pulses. Exam reveals no gallop.  No murmur heard. No leg edema, no JVD.  Pulmonary/Chest: Effort normal and breath sounds  normal.  Abdominal: Soft. Bowel sounds are normal.  Musculoskeletal: Normal range of motion.  Neurological: Erica Hamilton is alert.  Skin: Skin is warm and dry.  Psychiatric: Erica Hamilton has a normal mood and affect.   Laboratory examination:  No results for input(s): NA, K, CL, CO2, GLUCOSE, BUN, CREATININE, CALCIUM, GFRNONAA, GFRAA in the last 8760 hours. CrCl cannot be calculated (Patient's most recent lab result is older than the maximum 21 days allowed.).  CMP Latest Ref Rng & Units 09/16/2012 02/27/2011 12/21/2008  Glucose 70 - 99 mg/dL 103(H) 97 92  BUN 6 - 23 mg/dL 12 16 21   Creatinine 0.50 - 1.10 mg/dL 0.77 0.73 1.93(H)  Sodium 135 - 145 mEq/L 140 137 135  Potassium 3.5 - 5.1 mEq/L 4.2 4.1 4.1  Chloride 96 - 112 mEq/L 102 100 101  CO2 19 - 32 mEq/L 30 26 24   Calcium 8.4 - 10.5 mg/dL 9.9 9.9 9.7  Total Protein 6.0 - 8.3 g/dL 7.9 - 7.1  Total Bilirubin 0.3 - 1.2 mg/dL 0.3 - 0.7  Alkaline Phos 39 - 117 U/L 115 - 75  AST 0 - 37 U/L 19 - 25  ALT 0 - 35 U/L 14 - 14   CBC Latest Ref Rng & Units 09/16/2012 02/27/2011 12/21/2008  WBC 4.0 - 10.5 K/uL 5.3 5.7 9.1  Hemoglobin 12.0 - 15.0 g/dL 14.6 13.9 14.1  Hematocrit 36.0 - 46.0 % 43.9 40.7 40.1  Platelets 150 - 400 K/uL 245 232 229   Lipid Panel  No results found for: CHOL, TRIG, HDL, CHOLHDL, VLDL, LDLCALC, LDLDIRECT HEMOGLOBIN A1C No results found for: HGBA1C, MPG TSH No results for input(s): TSH in the last 8760 hours.   Labs 12/28/2018: HB 14.4/HCT 43.3, platelets 254.  Normal indicis.  Serum glucose 90 mg, BUN 15, creatinine 0.8, eGFR 77 mL, potassium 4.9, CMP otherwise normal.  Total cholesterol 09/24/41, triglycerides 105, HDL 84, LDL 138.  Non-HDL cholesterol 159.  TSH normal.  Vitamin D 30.  PTH normal. Medications and allergies   Allergies  Allergen Reactions  . Hydrocodone Nausea And Vomiting  . Sulfa Antibiotics Itching     Current Outpatient Medications  Medication Instructions  . ALPRAZolam (XANAX) 0.25-0.5 mg, Oral, Daily PRN  .  amLODipine (NORVASC) 2.5 mg, Oral, Daily  . ibuprofen (ADVIL) 400 mg, Every 6 hours PRN  . pantoprazole (PROTONIX) 40 MG tablet TAKE 1 TABLET BY MOUTH EVERY DAY  . Vitamin D, Cholecalciferol, 50 MCG (2000 UT) CAPS Oral, Daily   Radiology:  No results found. Cardiac Studies:   None  Assessment     ICD-10-CM   1. Chest tightness  R07.89 EKG 12-Lead    PCV ECHOCARDIOGRAM  COMPLETE    CT CARDIAC SCORING    PCV MYOCARDIAL PERFUSION WITH LEXISCAN  2. Dyspnea on exertion  R06.00 PCV ECHOCARDIOGRAM COMPLETE    PCV MYOCARDIAL PERFUSION WITH LEXISCAN  3. Labile hypertension  R09.89 PCV ECHOCARDIOGRAM COMPLETE    PCV MYOCARDIAL PERFUSION WITH LEXISCAN    amLODipine (NORVASC) 2.5 MG tablet  4. Pure hypercholesterolemia  E78.00     EKG: 03/13/19: Normal sinus rhythm at rate of 66 bpm, normal axis.  Poor progression, probably normal variant.  No evidence of ischemia.  Normal EKG. No significant change from 06/20/2019.  Recommendations:   Meds ordered this encounter  Medications  . amLODipine (NORVASC) 2.5 MG tablet    Sig: Take 1 tablet (2.5 mg total) by mouth daily.    Dispense:  30 tablet    Refill:  2    Erica Hamilton  is a 58 y.o.  With no significant prior cardiovascular history, mild hyperlipidemia, no history of tobacco use disorder or family history, referred to me for evaluation of chest pain that started about 6 weeks ago.  Schedule for a Lexiscan Sestamibi stress test to evaluate for myocardial ischemia. Patient unable to do treadmill stress testing due to COVID 19 to reduce risk of exposure. Will schedule for an echocardiogram. I'll also obtain coronary calcium score.  I would have a very low threshold to start him on statin therapy.  I started her on amlodipine 2.5 mg daily both for labile hypertension and also possible coronary vasospasm as an etiology as her chest pain is mostly noticed during the night but also present during the daytime.  I'll see her back after the test.   I reviewed the labs from her PCP, vitamin D is still at the lower limit of normal, Erica Hamilton could increase vitamin D from 2000 units per day to 3000 units per day.  Adrian Prows, MD, [facility] 07/31/2019, 11:21 AM [facility]. [place] Pager: [phone] Office: [phone] If no answer Cell [phone]

## 2019-08-03 ENCOUNTER — Ambulatory Visit (INDEPENDENT_AMBULATORY_CARE_PROVIDER_SITE_OTHER): Payer: BLUE CROSS/BLUE SHIELD

## 2019-08-03 ENCOUNTER — Other Ambulatory Visit: Payer: Self-pay

## 2019-08-03 DIAGNOSIS — R0989 Other specified symptoms and signs involving the circulatory and respiratory systems: Secondary | ICD-10-CM

## 2019-08-03 DIAGNOSIS — R0609 Other forms of dyspnea: Secondary | ICD-10-CM | POA: Diagnosis not present

## 2019-08-03 DIAGNOSIS — R0789 Other chest pain: Secondary | ICD-10-CM | POA: Diagnosis not present

## 2019-08-03 DIAGNOSIS — R0600 Dyspnea, unspecified: Secondary | ICD-10-CM

## 2019-08-04 DIAGNOSIS — E039 Hypothyroidism, unspecified: Secondary | ICD-10-CM | POA: Diagnosis not present

## 2019-08-08 ENCOUNTER — Ambulatory Visit
Admission: RE | Admit: 2019-08-08 | Discharge: 2019-08-08 | Disposition: A | Discharge status: Home / Self Care | Payer: BLUE CROSS/BLUE SHIELD | Source: Ambulatory Visit | Attending: Internal Medicine | Admitting: Internal Medicine

## 2019-08-08 DIAGNOSIS — E041 Nontoxic single thyroid nodule: Secondary | ICD-10-CM | POA: Diagnosis not present

## 2019-08-08 DIAGNOSIS — E042 Nontoxic multinodular goiter: Secondary | ICD-10-CM

## 2019-08-09 ENCOUNTER — Other Ambulatory Visit: Payer: Self-pay

## 2019-08-16 DIAGNOSIS — E042 Nontoxic multinodular goiter: Secondary | ICD-10-CM | POA: Diagnosis not present

## 2019-08-16 DIAGNOSIS — R030 Elevated blood-pressure reading, without diagnosis of hypertension: Secondary | ICD-10-CM | POA: Diagnosis not present

## 2019-08-16 DIAGNOSIS — Z87898 Personal history of other specified conditions: Secondary | ICD-10-CM | POA: Diagnosis not present

## 2019-08-23 ENCOUNTER — Ambulatory Visit (INDEPENDENT_AMBULATORY_CARE_PROVIDER_SITE_OTHER): Payer: Self-pay

## 2019-08-23 ENCOUNTER — Other Ambulatory Visit: Payer: Self-pay

## 2019-08-23 DIAGNOSIS — R0789 Other chest pain: Secondary | ICD-10-CM | POA: Diagnosis not present

## 2019-08-23 DIAGNOSIS — R0609 Other forms of dyspnea: Secondary | ICD-10-CM | POA: Diagnosis not present

## 2019-08-23 DIAGNOSIS — R0989 Other specified symptoms and signs involving the circulatory and respiratory systems: Secondary | ICD-10-CM | POA: Diagnosis not present

## 2019-08-23 DIAGNOSIS — R0600 Dyspnea, unspecified: Secondary | ICD-10-CM

## 2019-09-01 ENCOUNTER — Telehealth: Payer: BC Managed Care – PPO | Admitting: Cardiology

## 2019-09-01 ENCOUNTER — Encounter: Payer: Self-pay | Admitting: Cardiology

## 2019-09-01 ENCOUNTER — Other Ambulatory Visit: Payer: Self-pay

## 2019-09-01 VITALS — BP 144/85 | HR 83 | Ht 66.0 in | Wt 172.0 lb

## 2019-09-01 DIAGNOSIS — R0789 Other chest pain: Secondary | ICD-10-CM

## 2019-09-01 DIAGNOSIS — I10 Essential (primary) hypertension: Secondary | ICD-10-CM

## 2019-09-01 DIAGNOSIS — R0609 Other forms of dyspnea: Secondary | ICD-10-CM

## 2019-09-01 DIAGNOSIS — R0600 Dyspnea, unspecified: Secondary | ICD-10-CM

## 2019-09-01 DIAGNOSIS — R0989 Other specified symptoms and signs involving the circulatory and respiratory systems: Secondary | ICD-10-CM

## 2019-09-01 MED ORDER — AMLODIPINE BESYLATE 5 MG PO TABS: 5.0000 mg | ORAL_TABLET | Freq: Every day | ORAL | 3 refills | Status: AC

## 2019-09-01 NOTE — Progress Notes (Signed)
Virtual Visit via Video Note: This visit type was conducted due to national recommendations for restrictions regarding the COVID-19 Pandemic (e.g. social distancing).  This format is felt to be most appropriate for this patient at this time.  All issues noted in this document were discussed and addressed.  No physical exam was performed (except for noted visual exam findings with Telehealth visits).  The patient has consented to conduct a Telehealth visit and understands insurance will be billed.   I connected with@, on 09/01/19 at  by a video enabled telemedicine application and verified that I am speaking with the correct person using two identifiers.   I discussed the limitations of evaluation and management by telemedicine and the availability of in person appointments. The patient expressed understanding and agreed to proceed.   I have discussed with patient regarding the safety during COVID Pandemic and steps and precautions to be taken including social distancing, frequent hand wash and use of detergent soap, gels with the patient. I asked the patient to avoid touching mouth, nose, eyes, ears with the hands. I encouraged regular walking around the neighborhood and exercise and regular diet, as long as social distancing can be maintained.  Primary Physician/Referring:  Deland Pretty, MD  Patient ID: Erica Hamilton, female    DOB: Dec 26, 1960, 59 y.o.   MRN: [id number]  Chief Complaint  Patient presents with  . Chest Pain  . Results    stress, echo, coronary calcium  . Follow-up    4wk   HPI:    Erica Hamilton  is a 59 y.o.  with no significant prior cardiovascular history, mild hyperlipidemia, no history of tobacco use disorder or family history. She is being evaluated for chest pain and on her last OV I started her on amlodipine 2.5 mg daily both for labile hypertension and also possible coronary vasospasm as an etiology as her chest pain is mostly noticed during the night but also  present during the daytime. She does have history of GERD but states that this is under control with Nexium.  States that since being on amlodipine, blood pressure has been improved but still high in the 140s/high 80s.  Chest pain symptoms also improved.  No further dyspnea.  Chest pain described as tightness in the middle of the chest.  It is present continuously, but occasionally does get worse especially at night.  There is no radiation of chest pain.  She is also noticed dyspnea on exertion when she climbs a flight of stairs in her home and this is new.  No other associated palpitations, dizziness or syncope.  No leg edema.  Over the past few months he has also gained a few pounds due to Covid 19 and social distancing.    Past Medical History:  Diagnosis Date  . Hypothyroidism    Past Surgical History:  Procedure Laterality Date  . ABLATION     12 yrs ago  . APPENDECTOMY     age 51   Social History   Socioeconomic History  . Marital status: Divorced    Spouse name: Not on file  . Number of children: 0  . Years of education: Not on file  . Highest education level: Not on file  Occupational History  . Not on file  Tobacco Use  . Smoking status: Former Smoker    Types: Cigarettes  . Smokeless tobacco: Never Used  . Tobacco comment: smoked from age [age]  Substance and Sexual Activity  . Alcohol use: Yes  Comment: socially  . Drug use: No  . Sexual activity: Not on file  Other Topics Concern  . Not on file  Social History Narrative  . Not on file   Social Determinants of Health   Financial Resource Strain:   . Difficulty of Paying Living Expenses: Not on file  Food Insecurity:   . Worried About Charity fundraiser in the Last Year: Not on file  . Ran Out of Food in the Last Year: Not on file  Transportation Needs:   . Lack of Transportation (Medical): Not on file  . Lack of Transportation (Non-Medical): Not on file  Physical Activity:   . Days of Exercise per  Week: Not on file  . Minutes of Exercise per Session: Not on file  Stress:   . Feeling of Stress : Not on file  Social Connections:   . Frequency of Communication with Friends and Family: Not on file  . Frequency of Social Gatherings with Friends and Family: Not on file  . Attends Religious Services: Not on file  . Active Member of Clubs or Organizations: Not on file  . Attends Archivist Meetings: Not on file  . Marital Status: Not on file  Intimate Partner Violence:   . Fear of Current or Ex-Partner: Not on file  . Emotionally Abused: Not on file  . Physically Abused: Not on file  . Sexually Abused: Not on file   ROS  Review of Systems  Constitution: Positive for weight gain. Negative for chills, decreased appetite and malaise/fatigue.  Cardiovascular: Positive for chest pain. Negative for leg swelling and syncope.  Endocrine: Negative for cold intolerance.  Hematologic/Lymphatic: Does not bruise/bleed easily.  Musculoskeletal: Negative for joint swelling.  Gastrointestinal: Positive for heartburn. Negative for abdominal pain, anorexia, change in bowel habit, hematochezia and melena.  Neurological: Negative for headaches and light-headedness.  Psychiatric/Behavioral: Negative for depression and substance abuse.  All other systems reviewed and are negative.  Objective   Vitals with BMI 09/01/2019 07/31/2019 07/31/2019  Height 5' 6"  - 5' 6"   Weight 172 lbs - 179 lbs  BMI 46.27 - 03.50  Systolic 093 818 299  Diastolic 85 85 371  Pulse 83 - 68    Physical exam not performed or limited due to virtual visit.  Patient appeared to be in no distress, Neck was supple, respiration was not labored.  Please see exam details from prior visit is as below.  Physical Exam  Constitutional:  She is moderately built and well-nourished in no acute distress.  HENT:  Head: Atraumatic.  Eyes: Conjunctivae are normal.  Neck: No JVD present. No thyromegaly present.  Cardiovascular:  Normal rate, regular rhythm, normal heart sounds and intact distal pulses. Exam reveals no gallop.  No murmur heard. No leg edema, no JVD.  Pulmonary/Chest: Effort normal and breath sounds normal.  Abdominal: Soft. Bowel sounds are normal.  Musculoskeletal:        General: Normal range of motion.     Cervical back: Neck supple.  Neurological: She is alert.  Skin: Skin is warm and dry.  Psychiatric: She has a normal mood and affect.   Laboratory examination:  No results for input(s): NA, K, CL, CO2, GLUCOSE, BUN, CREATININE, CALCIUM, GFRNONAA, GFRAA in the last 8760 hours. CrCl cannot be calculated (Patient's most recent lab result is older than the maximum 21 days allowed.).  CMP Latest Ref Rng & Units 09/16/2012 02/27/2011 12/21/2008  Glucose 70 - 99 mg/dL 103(H) 97 92  BUN  6 - 23 mg/dL 12 16 21   Creatinine 0.50 - 1.10 mg/dL 0.77 0.73 1.93(H)  Sodium 135 - 145 mEq/L 140 137 135  Potassium 3.5 - 5.1 mEq/L 4.2 4.1 4.1  Chloride 96 - 112 mEq/L 102 100 101  CO2 19 - 32 mEq/L 30 26 24   Calcium 8.4 - 10.5 mg/dL 9.9 9.9 9.7  Total Protein 6.0 - 8.3 g/dL 7.9 - 7.1  Total Bilirubin 0.3 - 1.2 mg/dL 0.3 - 0.7  Alkaline Phos 39 - 117 U/L 115 - 75  AST 0 - 37 U/L 19 - 25  ALT 0 - 35 U/L 14 - 14   CBC Latest Ref Rng & Units 09/16/2012 02/27/2011 12/21/2008  WBC 4.0 - 10.5 K/uL 5.3 5.7 9.1  Hemoglobin 12.0 - 15.0 g/dL 14.6 13.9 14.1  Hematocrit 36.0 - 46.0 % 43.9 40.7 40.1  Platelets 150 - 400 K/uL 245 232 229   Lipid Panel  No results found for: CHOL, TRIG, HDL, CHOLHDL, VLDL, LDLCALC, LDLDIRECT HEMOGLOBIN A1C No results found for: HGBA1C, MPG TSH No results for input(s): TSH in the last 8760 hours.   Labs 12/28/2018: HB 14.4/HCT 43.3, platelets 254.  Normal indicis.  Serum glucose 90 mg, BUN 15, creatinine 0.8, eGFR 77 mL, potassium 4.9, CMP otherwise normal.  Total cholesterol 09/24/41, triglycerides 105, HDL 84, LDL 138.  Non-HDL cholesterol 159.  TSH normal.  Vitamin D 30.  PTH  normal. Medications and allergies   Allergies  Allergen Reactions  . Hydrocodone Nausea And Vomiting  . Sulfa Antibiotics Itching     Current Outpatient Medications  Medication Instructions  . ALPRAZolam (XANAX) 0.25-0.5 mg, Oral, Daily PRN  . amLODipine (NORVASC) 5 mg, Oral, Daily  . ibuprofen (ADVIL) 400 mg, Every 6 hours PRN  . pantoprazole (PROTONIX) 40 MG tablet TAKE 1 TABLET BY MOUTH EVERY DAY  . Vitamin D, Cholecalciferol, 50 MCG (2000 UT) CAPS Oral, Daily   Radiology:  No results found. Cardiac Studies:   Coronary calcium score 08/07/2019: Calcium score: 0. LM: 0. LAD: 0. Cx: 0. RCA: 0  Heart size is Normal. Visible lung fields are: clear. Lymph nodes: Not enlarged. Abdomen: No visible lesions.  Lexiscan Sestamibi stress test 08/23/2019: No previous exam available for comparison. Lexiscan/walking nuclear stress test performed using 1-day protocol. Stress EKG at 91% MPHR is non-diagnostic, as this is pharmacological stress test. Stress EKG showed sinus tachycardia, nonspecific ST-T changes inferolateral leads. Myocardial perfusion imaging is normal. Left ventricular ejection fraction is  69 % with normal wall motion.  Low risk study.  Echocardiogram 08/03/2019:  Normal LV systolic function with EF 68%. Left ventricle cavity is normal in size. Normal global wall motion. Normal diastolic filling pattern. Calculated EF 68%. Structurally normal tricuspid valve.  Mild tricuspid regurgitation. No evidence of pulmonary hypertension.  Assessment     ICD-10-CM   1. Chest tightness  R07.89   2. Dyspnea on exertion  R06.00   3. Essential (primary) hypertension  I10 amLODipine (NORVASC) 5 MG tablet  4. Labile hypertension  R09.89     EKG: 03/13/19: Normal sinus rhythm at rate of 66 bpm, normal axis.  Poor progression, probably normal variant.  No evidence of ischemia.  Normal EKG. No significant change from 06/20/2019.  Recommendations:   Meds ordered this encounter   Medications  . amLODipine (NORVASC) 5 MG tablet    Sig: Take 1 tablet (5 mg total) by mouth daily.    Dispense:  90 tablet    Refill:  3  Erica Hamilton  is a 59 y.o.  with no significant prior cardiovascular history, mild hyperlipidemia, no history of tobacco use disorder or family history. She is being evaluated for chest pain and on her last OV I started her on amlodipine 2.5 mg daily both for labile hypertension and also possible coronary vasospasm as an etiology as her chest pain is mostly noticed during the night but also present during the daytime. She is feeling better with this.   I reviewed the results of the echocardiogram, nuclear stress test which are essentially normal and also fortunately the coronary calcium score is 0.  Primary prevention is only indicated.  Suspect her chest pain symptoms may be related to Prinzmetal's angina and or related to hypertension or stress. She also has noticed her BP to be high 140/80s. Will increase amlodipine to 5 mg daily.  As long as she does well on amlodipine, continue the same.  I have reassured her and I will see her PRN.   Adrian Prows, MD, [facility] 09/01/2019, 2:48 PM [facility]. [place] Pager: [phone] Office: [phone] If no answer Cell [phone]

## 2020-01-04 DIAGNOSIS — E039 Hypothyroidism, unspecified: Secondary | ICD-10-CM | POA: Diagnosis not present

## 2020-01-04 DIAGNOSIS — I10 Essential (primary) hypertension: Secondary | ICD-10-CM | POA: Diagnosis not present

## 2020-01-04 DIAGNOSIS — E7800 Pure hypercholesterolemia, unspecified: Secondary | ICD-10-CM | POA: Diagnosis not present

## 2020-01-11 DIAGNOSIS — E7800 Pure hypercholesterolemia, unspecified: Secondary | ICD-10-CM | POA: Diagnosis not present

## 2020-01-11 DIAGNOSIS — I10 Essential (primary) hypertension: Secondary | ICD-10-CM | POA: Diagnosis not present

## 2020-01-11 DIAGNOSIS — E039 Hypothyroidism, unspecified: Secondary | ICD-10-CM | POA: Diagnosis not present

## 2020-01-11 DIAGNOSIS — Z0000 Encounter for general adult medical examination without abnormal findings: Secondary | ICD-10-CM | POA: Diagnosis not present

## 2020-02-27 DIAGNOSIS — I10 Essential (primary) hypertension: Secondary | ICD-10-CM | POA: Diagnosis not present

## 2020-04-12 DIAGNOSIS — I10 Essential (primary) hypertension: Secondary | ICD-10-CM | POA: Diagnosis not present

## 2020-06-11 DIAGNOSIS — Z01419 Encounter for gynecological examination (general) (routine) without abnormal findings: Secondary | ICD-10-CM | POA: Diagnosis not present

## 2020-06-11 DIAGNOSIS — Z1231 Encounter for screening mammogram for malignant neoplasm of breast: Secondary | ICD-10-CM | POA: Diagnosis not present

## 2020-06-11 DIAGNOSIS — Z124 Encounter for screening for malignant neoplasm of cervix: Secondary | ICD-10-CM | POA: Diagnosis not present

## 2020-06-11 DIAGNOSIS — Z6828 Body mass index (BMI) 28.0-28.9, adult: Secondary | ICD-10-CM | POA: Diagnosis not present

## 2020-06-19 DIAGNOSIS — K219 Gastro-esophageal reflux disease without esophagitis: Secondary | ICD-10-CM | POA: Diagnosis not present

## 2020-06-19 DIAGNOSIS — K625 Hemorrhage of anus and rectum: Secondary | ICD-10-CM | POA: Diagnosis not present

## 2020-06-19 DIAGNOSIS — R197 Diarrhea, unspecified: Secondary | ICD-10-CM | POA: Diagnosis not present

## 2020-06-20 DIAGNOSIS — K625 Hemorrhage of anus and rectum: Secondary | ICD-10-CM | POA: Diagnosis not present

## 2020-06-20 DIAGNOSIS — R197 Diarrhea, unspecified: Secondary | ICD-10-CM | POA: Diagnosis not present

## 2020-06-20 DIAGNOSIS — R109 Unspecified abdominal pain: Secondary | ICD-10-CM | POA: Diagnosis not present

## 2020-07-30 ENCOUNTER — Other Ambulatory Visit: Payer: Self-pay | Admitting: Internal Medicine

## 2020-07-30 DIAGNOSIS — E042 Nontoxic multinodular goiter: Secondary | ICD-10-CM

## 2020-09-10 ENCOUNTER — Ambulatory Visit
Admission: RE | Admit: 2020-09-10 | Discharge: 2020-09-10 | Disposition: A | Discharge status: Home / Self Care | Payer: BC Managed Care – PPO | Source: Ambulatory Visit | Attending: Internal Medicine | Admitting: Internal Medicine

## 2020-09-10 DIAGNOSIS — E042 Nontoxic multinodular goiter: Secondary | ICD-10-CM

## 2020-09-16 DIAGNOSIS — Z87898 Personal history of other specified conditions: Secondary | ICD-10-CM | POA: Diagnosis not present

## 2020-09-16 DIAGNOSIS — R030 Elevated blood-pressure reading, without diagnosis of hypertension: Secondary | ICD-10-CM | POA: Diagnosis not present

## 2020-09-16 DIAGNOSIS — E042 Nontoxic multinodular goiter: Secondary | ICD-10-CM | POA: Diagnosis not present

## 2020-11-18 DIAGNOSIS — R2232 Localized swelling, mass and lump, left upper limb: Secondary | ICD-10-CM | POA: Diagnosis not present

## 2020-11-18 DIAGNOSIS — M79622 Pain in left upper arm: Secondary | ICD-10-CM | POA: Diagnosis not present

## 2020-11-21 DIAGNOSIS — N6332 Unspecified lump in axillary tail of the left breast: Secondary | ICD-10-CM | POA: Diagnosis not present

## 2020-11-21 DIAGNOSIS — M79622 Pain in left upper arm: Secondary | ICD-10-CM | POA: Diagnosis not present

## 2020-11-21 DIAGNOSIS — R2232 Localized swelling, mass and lump, left upper limb: Secondary | ICD-10-CM | POA: Diagnosis not present

## 2021-03-04 DIAGNOSIS — E042 Nontoxic multinodular goiter: Secondary | ICD-10-CM | POA: Diagnosis not present

## 2021-04-02 DIAGNOSIS — E559 Vitamin D deficiency, unspecified: Secondary | ICD-10-CM | POA: Diagnosis not present

## 2021-04-02 DIAGNOSIS — I10 Essential (primary) hypertension: Secondary | ICD-10-CM | POA: Diagnosis not present

## 2021-04-02 DIAGNOSIS — E042 Nontoxic multinodular goiter: Secondary | ICD-10-CM | POA: Diagnosis not present

## 2021-04-02 DIAGNOSIS — Z0000 Encounter for general adult medical examination without abnormal findings: Secondary | ICD-10-CM | POA: Diagnosis not present

## 2021-05-15 DIAGNOSIS — E559 Vitamin D deficiency, unspecified: Secondary | ICD-10-CM | POA: Diagnosis not present

## 2021-05-15 DIAGNOSIS — E039 Hypothyroidism, unspecified: Secondary | ICD-10-CM | POA: Diagnosis not present

## 2021-05-22 DIAGNOSIS — E042 Nontoxic multinodular goiter: Secondary | ICD-10-CM | POA: Diagnosis not present

## 2021-05-22 DIAGNOSIS — E559 Vitamin D deficiency, unspecified: Secondary | ICD-10-CM | POA: Diagnosis not present

## 2021-05-22 DIAGNOSIS — R030 Elevated blood-pressure reading, without diagnosis of hypertension: Secondary | ICD-10-CM | POA: Diagnosis not present

## 2021-06-20 DIAGNOSIS — Z01419 Encounter for gynecological examination (general) (routine) without abnormal findings: Secondary | ICD-10-CM | POA: Diagnosis not present

## 2021-06-20 DIAGNOSIS — Z1231 Encounter for screening mammogram for malignant neoplasm of breast: Secondary | ICD-10-CM | POA: Diagnosis not present

## 2021-08-26 ENCOUNTER — Other Ambulatory Visit: Payer: Self-pay | Admitting: Internal Medicine

## 2021-08-26 DIAGNOSIS — E042 Nontoxic multinodular goiter: Secondary | ICD-10-CM

## 2021-08-27 ENCOUNTER — Ambulatory Visit
Admission: RE | Admit: 2021-08-27 | Discharge: 2021-08-27 | Disposition: A | Discharge status: Home / Self Care | Payer: BC Managed Care – PPO | Source: Ambulatory Visit | Attending: Internal Medicine | Admitting: Internal Medicine

## 2021-08-27 DIAGNOSIS — E042 Nontoxic multinodular goiter: Secondary | ICD-10-CM

## 2021-08-27 DIAGNOSIS — E559 Vitamin D deficiency, unspecified: Secondary | ICD-10-CM | POA: Diagnosis not present

## 2021-09-03 DIAGNOSIS — R030 Elevated blood-pressure reading, without diagnosis of hypertension: Secondary | ICD-10-CM | POA: Diagnosis not present

## 2021-09-03 DIAGNOSIS — E042 Nontoxic multinodular goiter: Secondary | ICD-10-CM | POA: Diagnosis not present

## 2021-09-03 DIAGNOSIS — E210 Primary hyperparathyroidism: Secondary | ICD-10-CM | POA: Diagnosis not present

## 2021-09-18 DIAGNOSIS — M546 Pain in thoracic spine: Secondary | ICD-10-CM | POA: Diagnosis not present

## 2021-09-18 DIAGNOSIS — K219 Gastro-esophageal reflux disease without esophagitis: Secondary | ICD-10-CM | POA: Diagnosis not present

## 2022-03-31 DIAGNOSIS — M5451 Vertebrogenic low back pain: Secondary | ICD-10-CM | POA: Diagnosis not present

## 2022-03-31 DIAGNOSIS — M25562 Pain in left knee: Secondary | ICD-10-CM | POA: Diagnosis not present

## 2022-04-16 DIAGNOSIS — E210 Primary hyperparathyroidism: Secondary | ICD-10-CM | POA: Diagnosis not present

## 2022-05-20 DIAGNOSIS — E7800 Pure hypercholesterolemia, unspecified: Secondary | ICD-10-CM | POA: Diagnosis not present

## 2022-05-20 DIAGNOSIS — E039 Hypothyroidism, unspecified: Secondary | ICD-10-CM | POA: Diagnosis not present

## 2022-05-20 DIAGNOSIS — Z0000 Encounter for general adult medical examination without abnormal findings: Secondary | ICD-10-CM | POA: Diagnosis not present

## 2022-05-20 DIAGNOSIS — E559 Vitamin D deficiency, unspecified: Secondary | ICD-10-CM | POA: Diagnosis not present

## 2022-05-27 DIAGNOSIS — E559 Vitamin D deficiency, unspecified: Secondary | ICD-10-CM | POA: Diagnosis not present

## 2022-05-27 DIAGNOSIS — Z0000 Encounter for general adult medical examination without abnormal findings: Secondary | ICD-10-CM | POA: Diagnosis not present

## 2022-05-27 DIAGNOSIS — K219 Gastro-esophageal reflux disease without esophagitis: Secondary | ICD-10-CM | POA: Diagnosis not present

## 2022-05-27 DIAGNOSIS — Z23 Encounter for immunization: Secondary | ICD-10-CM | POA: Diagnosis not present

## 2022-05-27 DIAGNOSIS — I10 Essential (primary) hypertension: Secondary | ICD-10-CM | POA: Diagnosis not present

## 2022-06-26 DIAGNOSIS — Z1231 Encounter for screening mammogram for malignant neoplasm of breast: Secondary | ICD-10-CM | POA: Diagnosis not present

## 2022-06-26 DIAGNOSIS — Z01419 Encounter for gynecological examination (general) (routine) without abnormal findings: Secondary | ICD-10-CM | POA: Diagnosis not present

## 2022-10-05 DIAGNOSIS — E042 Nontoxic multinodular goiter: Secondary | ICD-10-CM | POA: Diagnosis not present

## 2022-10-05 DIAGNOSIS — E210 Primary hyperparathyroidism: Secondary | ICD-10-CM | POA: Diagnosis not present

## 2022-11-11 DIAGNOSIS — E210 Primary hyperparathyroidism: Secondary | ICD-10-CM | POA: Diagnosis not present

## 2022-11-11 DIAGNOSIS — E039 Hypothyroidism, unspecified: Secondary | ICD-10-CM | POA: Diagnosis not present

## 2022-11-13 DIAGNOSIS — E039 Hypothyroidism, unspecified: Secondary | ICD-10-CM | POA: Diagnosis not present

## 2022-11-13 DIAGNOSIS — E210 Primary hyperparathyroidism: Secondary | ICD-10-CM | POA: Diagnosis not present

## 2023-07-02 DIAGNOSIS — Z0000 Encounter for general adult medical examination without abnormal findings: Secondary | ICD-10-CM | POA: Diagnosis not present

## 2023-07-02 DIAGNOSIS — E7800 Pure hypercholesterolemia, unspecified: Secondary | ICD-10-CM | POA: Diagnosis not present

## 2023-07-02 DIAGNOSIS — E559 Vitamin D deficiency, unspecified: Secondary | ICD-10-CM | POA: Diagnosis not present

## 2023-07-09 DIAGNOSIS — E210 Primary hyperparathyroidism: Secondary | ICD-10-CM | POA: Diagnosis not present

## 2023-07-09 DIAGNOSIS — Z0000 Encounter for general adult medical examination without abnormal findings: Secondary | ICD-10-CM | POA: Diagnosis not present

## 2023-07-09 DIAGNOSIS — H6122 Impacted cerumen, left ear: Secondary | ICD-10-CM | POA: Diagnosis not present

## 2023-07-16 DIAGNOSIS — Z01419 Encounter for gynecological examination (general) (routine) without abnormal findings: Secondary | ICD-10-CM | POA: Diagnosis not present

## 2023-07-16 DIAGNOSIS — Z124 Encounter for screening for malignant neoplasm of cervix: Secondary | ICD-10-CM | POA: Diagnosis not present

## 2023-07-16 DIAGNOSIS — Z1231 Encounter for screening mammogram for malignant neoplasm of breast: Secondary | ICD-10-CM | POA: Diagnosis not present

## 2023-08-13 DIAGNOSIS — E042 Nontoxic multinodular goiter: Secondary | ICD-10-CM | POA: Diagnosis not present

## 2023-08-13 DIAGNOSIS — E210 Primary hyperparathyroidism: Secondary | ICD-10-CM | POA: Diagnosis not present

## 2023-08-27 DIAGNOSIS — R519 Headache, unspecified: Secondary | ICD-10-CM | POA: Diagnosis not present

## 2023-08-30 DIAGNOSIS — R519 Headache, unspecified: Secondary | ICD-10-CM | POA: Diagnosis not present

## 2023-08-30 DIAGNOSIS — J0110 Acute frontal sinusitis, unspecified: Secondary | ICD-10-CM | POA: Diagnosis not present

## 2023-09-28 ENCOUNTER — Other Ambulatory Visit (HOSPITAL_COMMUNITY): Payer: Self-pay | Admitting: Nurse Practitioner

## 2023-10-13 ENCOUNTER — Encounter (HOSPITAL_COMMUNITY): Payer: BC Managed Care – PPO

## 2023-10-13 ENCOUNTER — Ambulatory Visit (HOSPITAL_COMMUNITY): Payer: BC Managed Care – PPO

## 2023-10-20 ENCOUNTER — Encounter (HOSPITAL_COMMUNITY)
Admission: RE | Admit: 2023-10-20 | Discharge: 2023-10-20 | Disposition: A | Discharge status: Home / Self Care | Payer: BC Managed Care – PPO | Source: Ambulatory Visit | Attending: Nurse Practitioner | Admitting: Nurse Practitioner

## 2023-10-20 MED ORDER — TECHNETIUM TC 99M SESTAMIBI GENERIC - CARDIOLITE
26.4000 | Freq: Once | INTRAVENOUS | Status: AC | PRN
  Administered 2023-10-20: 26.4 via INTRAVENOUS

## 2023-11-02 DIAGNOSIS — H903 Sensorineural hearing loss, bilateral: Secondary | ICD-10-CM | POA: Diagnosis not present

## 2023-11-02 DIAGNOSIS — H9202 Otalgia, left ear: Secondary | ICD-10-CM | POA: Diagnosis not present

## 2024-04-12 DIAGNOSIS — S92901A Unspecified fracture of right foot, initial encounter for closed fracture: Secondary | ICD-10-CM | POA: Diagnosis not present

## 2024-04-12 DIAGNOSIS — S92351A Displaced fracture of fifth metatarsal bone, right foot, initial encounter for closed fracture: Secondary | ICD-10-CM | POA: Diagnosis not present

## 2024-04-12 DIAGNOSIS — M79671 Pain in right foot: Secondary | ICD-10-CM | POA: Diagnosis not present

## 2024-04-26 DIAGNOSIS — S92351D Displaced fracture of fifth metatarsal bone, right foot, subsequent encounter for fracture with routine healing: Secondary | ICD-10-CM | POA: Diagnosis not present

## 2024-05-05 DIAGNOSIS — E039 Hypothyroidism, unspecified: Secondary | ICD-10-CM | POA: Diagnosis not present

## 2024-05-19 ENCOUNTER — Other Ambulatory Visit: Payer: Self-pay | Admitting: Nurse Practitioner

## 2024-05-19 DIAGNOSIS — E210 Primary hyperparathyroidism: Secondary | ICD-10-CM | POA: Diagnosis not present

## 2024-05-19 DIAGNOSIS — M8589 Other specified disorders of bone density and structure, multiple sites: Secondary | ICD-10-CM | POA: Diagnosis not present

## 2024-05-19 DIAGNOSIS — E042 Nontoxic multinodular goiter: Secondary | ICD-10-CM | POA: Diagnosis not present

## 2024-05-19 DIAGNOSIS — E039 Hypothyroidism, unspecified: Secondary | ICD-10-CM | POA: Diagnosis not present

## 2024-05-19 DIAGNOSIS — E559 Vitamin D deficiency, unspecified: Secondary | ICD-10-CM | POA: Diagnosis not present

## 2024-05-23 ENCOUNTER — Ambulatory Visit
Admission: RE | Admit: 2024-05-23 | Discharge: 2024-05-23 | Disposition: A | Discharge status: Home / Self Care | Source: Ambulatory Visit | Attending: Nurse Practitioner | Admitting: Nurse Practitioner

## 2024-05-23 DIAGNOSIS — E042 Nontoxic multinodular goiter: Secondary | ICD-10-CM

## 2024-05-24 DIAGNOSIS — S92351D Displaced fracture of fifth metatarsal bone, right foot, subsequent encounter for fracture with routine healing: Secondary | ICD-10-CM | POA: Diagnosis not present

## 2024-06-29 DIAGNOSIS — E210 Primary hyperparathyroidism: Secondary | ICD-10-CM | POA: Diagnosis not present

## 2024-06-30 ENCOUNTER — Other Ambulatory Visit: Payer: Self-pay | Admitting: Surgery

## 2024-06-30 DIAGNOSIS — E213 Hyperparathyroidism, unspecified: Secondary | ICD-10-CM

## 2024-07-06 ENCOUNTER — Ambulatory Visit
Admission: RE | Admit: 2024-07-06 | Discharge: 2024-07-06 | Disposition: A | Source: Ambulatory Visit | Attending: Surgery | Admitting: Surgery

## 2024-07-06 DIAGNOSIS — E213 Hyperparathyroidism, unspecified: Secondary | ICD-10-CM

## 2024-07-06 MED ORDER — IOPAMIDOL (ISOVUE-370) INJECTION 76%
75.0000 mL | Freq: Once | INTRAVENOUS | Status: AC | PRN
  Administered 2024-07-06: 75 mL via INTRAVENOUS

## 2024-07-10 ENCOUNTER — Ambulatory Visit: Payer: Self-pay | Admitting: Surgery

## 2024-07-10 NOTE — Progress Notes (Signed)
 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  Will plan to proceed with minimally invasive parathyroidectomy as discussed in the office.  Will send orders to schedulers to contact the patient.  Krystal Spinner, MD [facility] practice Office: [phone]

## 2024-07-14 DIAGNOSIS — E039 Hypothyroidism, unspecified: Secondary | ICD-10-CM | POA: Diagnosis not present

## 2024-07-14 DIAGNOSIS — E559 Vitamin D deficiency, unspecified: Secondary | ICD-10-CM | POA: Diagnosis not present

## 2024-07-19 DIAGNOSIS — Z01419 Encounter for gynecological examination (general) (routine) without abnormal findings: Secondary | ICD-10-CM | POA: Diagnosis not present

## 2024-07-19 DIAGNOSIS — Z1231 Encounter for screening mammogram for malignant neoplasm of breast: Secondary | ICD-10-CM | POA: Diagnosis not present

## 2024-07-27 NOTE — Progress Notes (Signed)
 Date of COVID positive in last 90 days:  PCP - Ryan Hives, MD Cardiologist - Gordy Bergamo, MD saw in 2020 for CP, f/p PRN  Chest x-ray - N/A EKG - 07/28/24 Epic/chart Stress Test - 08/23/19 Epic ECHO - 08/03/19 Epic Cardiac Cath - N/A Pacemaker/ICD device last checked:N/A Spinal Cord Stimulator:N/A  Bowel Prep - N/A  Sleep Study - N/A CPAP -   Fasting Blood Sugar - N/A Checks Blood Sugar _____ times a day  Last dose of GLP1 agonist-  N/A GLP1 instructions:  Do not take after     Last dose of SGLT-2 inhibitors-  N/A SGLT-2 instructions:  Do not take after     Blood Thinner Instructions: N/A Last dose:   Time: Aspirin Instructions:N/A Last Dose:  Activity level: Can go up a flight of stairs and perform activities of daily living without stopping and without symptoms of chest pain or shortness of breath.  Anesthesia review: EKG changes, HTN  Patient denies shortness of breath, fever, cough and chest pain at PAT appointment  Patient verbalized understanding of instructions that were given to them at the PAT appointment. Patient was also instructed that they will need to review over the PAT instructions again at home before surgery.

## 2024-07-28 ENCOUNTER — Encounter (HOSPITAL_COMMUNITY)
Admission: RE | Admit: 2024-07-28 | Discharge: 2024-07-28 | Disposition: A | Source: Ambulatory Visit | Attending: Surgery | Admitting: Surgery

## 2024-07-28 ENCOUNTER — Other Ambulatory Visit: Payer: Self-pay

## 2024-07-28 ENCOUNTER — Encounter (HOSPITAL_COMMUNITY): Payer: Self-pay

## 2024-07-28 VITALS — BP 135/83 | HR 90 | Temp 98.4°F | Resp 16 | Ht 66.0 in | Wt 162.0 lb

## 2024-07-28 DIAGNOSIS — Z0000 Encounter for general adult medical examination without abnormal findings: Secondary | ICD-10-CM | POA: Diagnosis not present

## 2024-07-28 DIAGNOSIS — I10 Essential (primary) hypertension: Secondary | ICD-10-CM | POA: Diagnosis not present

## 2024-07-28 DIAGNOSIS — Z01818 Encounter for other preprocedural examination: Secondary | ICD-10-CM

## 2024-07-28 DIAGNOSIS — E7800 Pure hypercholesterolemia, unspecified: Secondary | ICD-10-CM | POA: Diagnosis not present

## 2024-07-28 DIAGNOSIS — Z01810 Encounter for preprocedural cardiovascular examination: Secondary | ICD-10-CM | POA: Diagnosis not present

## 2024-07-28 DIAGNOSIS — E210 Primary hyperparathyroidism: Secondary | ICD-10-CM | POA: Diagnosis not present

## 2024-07-28 HISTORY — DX: Gastro-esophageal reflux disease without esophagitis: K21.9

## 2024-07-28 HISTORY — DX: Essential (primary) hypertension: I10

## 2024-07-28 HISTORY — DX: Nausea with vomiting, unspecified: R11.2

## 2024-07-28 NOTE — Patient Instructions (Addendum)
 SURGICAL WAITING ROOM VISITATION  Patients having surgery or a procedure may have no more than 2 support people in the waiting area - these visitors may rotate.    Children under the age of 29 must have an adult with them who is not the patient.  Visitors with respiratory illnesses are discouraged from visiting and should remain at home.  If the patient needs to stay at the hospital during part of their recovery, the visitor guidelines for inpatient rooms apply. Pre-op nurse will coordinate an appropriate time for 1 support person to accompany patient in pre-op.  This support person may not rotate.    Please refer to the [facility] website for the visitor guidelines for Inpatients (after your surgery is over and you are in a regular room).    Your procedure is scheduled on: 08/07/24   Report to [facility] Main Entrance    Report to admitting at 7:15 AM   Call this number if you have problems the morning of surgery [phone]   Do not eat food :After Midnight.   After Midnight you may have the following liquids until 6:30 AM DAY OF SURGERY  Water Non-Citrus Juices (without pulp, NO RED-Apple, White grape, White cranberry) Black Coffee (NO MILK/CREAM OR CREAMERS, sugar ok)  Clear Tea (NO MILK/CREAM OR CREAMERS, sugar ok) regular and decaf                             Plain Jell-O (NO RED)                                           Fruit ices (not with fruit pulp, NO RED)                                     Popsicles (NO RED)                                                               Sports drinks like Gatorade (NO RED)                      If you have questions, please contact your surgeon's office.   FOLLOW BOWEL PREP AND ANY ADDITIONAL PRE OP INSTRUCTIONS YOU RECEIVED FROM YOUR SURGEON'S OFFICE!!!     Oral Hygiene is also important to reduce your risk of infection.                                    Remember - BRUSH YOUR TEETH THE MORNING OF SURGERY WITH YOUR  REGULAR TOOTHPASTE  DENTURES WILL BE REMOVED PRIOR TO SURGERY PLEASE DO NOT APPLY Poly grip OR ADHESIVES!!!   Stop all vitamins and herbal supplements 7 days before surgery.   Take these medicines the morning of surgery with A SIP OF WATER: Amlodipine , Pantoprazole  You may not have any metal on your body including hair pins, jewelry, and body piercing             Do not wear make-up, lotions, powders, perfumes, or deodorant  Do not wear nail polish including gel and S&S, artificial/acrylic nails, or any other type of covering on natural nails including finger and toenails. If you have artificial nails, gel coating, etc. that needs to be removed by a nail salon please have this removed prior to surgery or surgery may need to be canceled/ delayed if the surgeon/ anesthesia feels like they are unable to be safely monitored.   Do not shave  48 hours prior to surgery.    Do not bring valuables to the hospital. [facility] IS NOT             RESPONSIBLE   FOR VALUABLES.   Contacts, glasses, dentures or bridgework may not be worn into surgery.  DO NOT BRING YOUR HOME MEDICATIONS TO THE HOSPITAL. PHARMACY WILL DISPENSE MEDICATIONS LISTED ON YOUR MEDICATION LIST TO YOU DURING YOUR ADMISSION IN THE HOSPITAL!    Patients discharged on the day of surgery will not be allowed to drive home.  Someone NEEDS to stay with you for the first 24 hours after anesthesia.   Special Instructions: Bring a copy of your healthcare power of attorney and living will documents the day of surgery if you haven't scanned them before.              Please read over the following fact sheets you were given: IF YOU HAVE QUESTIONS ABOUT YOUR PRE-OP INSTRUCTIONS PLEASE CALL [phone].   If you received a COVID test during your pre-op visit  it is requested that you wear a mask when out in public, stay away from anyone that may not be feeling well and notify your surgeon if you  develop symptoms. If you test positive for Covid or have been in contact with anyone that has tested positive in the last 10 days please notify you surgeon.     - Preparing for Surgery Before surgery, you can play an important role.  Because skin is not sterile, your skin needs to be as free of germs as possible.  You can reduce the number of germs on your skin by washing with CHG (chlorahexidine gluconate) soap before surgery.  CHG is an antiseptic cleaner which kills germs and bonds with the skin to continue killing germs even after washing. Please DO NOT use if you have an allergy to CHG or antibacterial soaps.  If your skin becomes reddened/irritated stop using the CHG and inform your nurse when you arrive at Short Stay. Do not shave (including legs and underarms) for at least 48 hours prior to the first CHG shower.  You may shave your face/neck.  Please follow these instructions carefully:  1.  Shower with CHG Soap the night before surgery ONLY (DO NOT USE THE SOAP THE MORNING OF SURGERY).  2.  If you choose to wash your hair, wash your hair first as usual with your normal  shampoo.  3.  After you shampoo, rinse your hair and body thoroughly to remove the shampoo.                             4.  Use CHG as you would any other liquid soap.  You can apply chg directly to the skin and wash.  Gently with a scrungie or clean washcloth.  5.  Apply the CHG Soap to your body ONLY FROM THE NECK DOWN.   Do   not use on face/ open                           Wound or open sores. Avoid contact with eyes, ears mouth and   genitals (private parts).                       Wash face,  Genitals (private parts) with your normal soap.             6.  Wash thoroughly, paying special attention to the area where your    surgery  will be performed.  7.  Thoroughly rinse your body with warm water from the neck down.  8.  DO NOT shower/wash with your normal soap after using and rinsing off the CHG Soap.                 9.  Pat yourself dry with a clean towel.            10.  Wear clean pajamas.            11.  Place clean sheets on your bed the night of your first shower and do not  sleep with pets. Day of Surgery : Do not apply any CHG, lotions/deodorants the morning of surgery.  Please wear clean clothes to the hospital/surgery center.  FAILURE TO FOLLOW THESE INSTRUCTIONS MAY RESULT IN THE CANCELLATION OF YOUR SURGERY  PATIENT SIGNATURE_________________________________  NURSE SIGNATURE__________________________________  ________________________________________________________________________

## 2024-08-05 ENCOUNTER — Encounter (HOSPITAL_COMMUNITY): Payer: Self-pay | Admitting: Surgery

## 2024-08-05 NOTE — H&P (Signed)
 REFERRING PHYSICIAN: Roy Raisin, NP  PROVIDER: Jamicheal Heard OZELL SPINNER, MD   Chief Complaint: New Consultation (Primary hyperparathyroidism)  History of Present Illness:  Patient is referred by Raisin Roy, NP, for surgical evaluation and recommendations regarding management of primary hyperparathyroidism. Patient's primary care physician is Dr. Ryan Hives. Patient was originally diagnosed with hypercalcemia approximately 3 years ago. He was evaluated by endocrinology. Patient has had persistent elevated calcium levels ranging up to 10.8. Intact PTH level is elevated now at 82. Patient has undergone imaging studies including an ultrasound examination and a nuclear medicine parathyroid  scan with sestamibi. Both of these failed to identify a parathyroid  adenoma. Patient was noted to have bilateral thyroid  nodules which will require annual ultrasound evaluation. Patient has developed symptoms. She notes significant fatigue. She has bone and joint discomfort. She denies nephrolithiasis. Patient describes brain fog. She does have osteoporosis. Patient has had no prior head or neck surgery. There is no family history of parathyroid  disease or other endocrine neoplasms.  Review of Systems: A complete review of systems was obtained from the patient. I have reviewed this information and discussed as appropriate with the patient. See HPI as well for other ROS.  ROS   Medical History: Past Medical History:  Diagnosis Date  GERD (gastroesophageal reflux disease)  Hypertension  Hypothyroidism   Patient Active Problem List  Diagnosis  Primary hyperparathyroidism (HHS-HCC)   Past Surgical History:  Procedure Laterality Date  .Ablation  APPENDECTOMY    Allergies  Allergen Reactions  Hydrocodone Other (See Comments), Nausea And Vomiting and Vomiting  Other Itching  Sulfa (Sulfonamide Antibiotics) Itching and Other (See Comments)   Current Outpatient Medications on File Prior to  Visit  Medication Sig Dispense Refill  amLODIPine  (NORVASC ) 5 MG tablet Take 5 mg by mouth once daily  pantoprazole (PROTONIX) 40 MG DR tablet TAKE 1 TABLET BY MOUTH TWICE A DAY 90 DAYS  cholecalciferol (VITAMIN D3) 2,000 unit capsule Take by mouth once daily  levothyroxine (SYNTHROID) 50 MCG tablet Take 50 mcg by mouth once daily   No current facility-administered medications on file prior to visit.   Family History  Problem Relation Age of Onset  Dementia Mother  Hyperlipidemia (Elevated cholesterol) Mother  Lung cancer Father  Emphysema Father    Social History   Tobacco Use  Smoking Status Former  Types: Cigarettes  Smokeless Tobacco Never    Social History   Socioeconomic History  Marital status: Divorced  Tobacco Use  Smoking status: Former  Types: Cigarettes  Smokeless tobacco: Never  Substance and Sexual Activity  Drug use: Never   Social Drivers of Health   Received from [employer]  Social Network  Housing Stability: Unknown (06/29/2024)  Housing Stability Vital Sign  Homeless in the Last Year: No   Objective:   Vitals:  BP: 139/86  Pulse: 85  Temp: 36.7 C (98 F)  SpO2: 98%  Weight: 80.5 kg (177 lb 6.4 oz)  Height: 167.6 cm (5' 6)  PainSc: 0-No pain   Body mass index is 28.63 kg/m.  Physical Exam   GENERAL APPEARANCE Comfortable, no acute issues Development: normal Gross deformities: none  SKIN Rash, lesions, ulcers: none Induration, erythema: none Nodules: none palpable  EYES Conjunctiva and lids: normal Pupils: equal  EARS, NOSE, MOUTH, THROAT External ears: no lesion or deformity External nose: no lesion or deformity Hearing: grossly normal  NECK Symmetric: yes Trachea: midline Thyroid : no palpable nodules in the thyroid  bed  CHEST/CV Not assessed  ABDOMEN Not  assessed  GENITOURINARY/RECTAL Not assessed  MUSCULOSKELETAL Station and gait: normal Digits and nails: no clubbing or cyanosis Muscle strength:  grossly normal all extremities Deformity: none  LYMPHATIC Cervical: none palpable Supraclavicular: none palpable  PSYCHIATRIC Oriented to person, place, and time: yes Mood and affect: normal for situation Judgment and insight: appropriate for situation   Assessment and Plan:   Primary hyperparathyroidism (HHS-HCC)  Patient is referred by Harlene Bill, NP, for surgical evaluation and management of primary hyperparathyroidism.  Patient provided with a copy of Parathyroid  Surgery: Treatment for Your Parathyroid  Gland Problem, published by [employer], 12 pages. Book reviewed and explained to patient during visit today.  Today we reviewed her clinical history. We reviewed her laboratory studies. We reviewed her recent imaging studies. I would like to proceed with a 4D CT scan of the neck with parathyroid  protocol in hopes of localizing a parathyroid  adenoma and confirming her diagnosis. If this is successful, then she will be a good candidate for minimally invasive outpatient surgery. We discussed the procedure today. We discussed the size and location of the surgical incision. We discussed doing this as an outpatient procedure. If the patient fails to localize an adenoma on CT scanning, then we may consider a traditional neck exploration. This could possibly require an overnight hospital stay.  Patient will undergo CT scan of the neck. We will contact her with the results when they are available and make plans for further management at that time.   Krystal Spinner, MD [facility] practice Office: [phone]

## 2024-08-07 ENCOUNTER — Encounter (HOSPITAL_COMMUNITY): Payer: Self-pay | Admitting: Surgery

## 2024-08-07 ENCOUNTER — Ambulatory Visit (HOSPITAL_COMMUNITY): Payer: Self-pay

## 2024-08-07 ENCOUNTER — Encounter (HOSPITAL_COMMUNITY): Admission: RE | Disposition: A | Payer: Self-pay | Source: Ambulatory Visit | Attending: Surgery

## 2024-08-07 ENCOUNTER — Encounter (HOSPITAL_COMMUNITY): Payer: Self-pay

## 2024-08-07 ENCOUNTER — Ambulatory Visit (HOSPITAL_COMMUNITY): Admission: RE | Admit: 2024-08-07 | Discharge: 2024-08-07 | Disposition: A | Attending: Surgery | Admitting: Surgery

## 2024-08-07 DIAGNOSIS — D351 Benign neoplasm of parathyroid gland: Secondary | ICD-10-CM | POA: Insufficient documentation

## 2024-08-07 DIAGNOSIS — E210 Primary hyperparathyroidism: Secondary | ICD-10-CM | POA: Insufficient documentation

## 2024-08-07 DIAGNOSIS — Z79899 Other long term (current) drug therapy: Secondary | ICD-10-CM | POA: Insufficient documentation

## 2024-08-07 DIAGNOSIS — K219 Gastro-esophageal reflux disease without esophagitis: Secondary | ICD-10-CM | POA: Diagnosis not present

## 2024-08-07 DIAGNOSIS — E042 Nontoxic multinodular goiter: Secondary | ICD-10-CM | POA: Insufficient documentation

## 2024-08-07 DIAGNOSIS — Z87891 Personal history of nicotine dependence: Secondary | ICD-10-CM | POA: Insufficient documentation

## 2024-08-07 DIAGNOSIS — M810 Age-related osteoporosis without current pathological fracture: Secondary | ICD-10-CM | POA: Diagnosis not present

## 2024-08-07 DIAGNOSIS — Z79890 Hormone replacement therapy (postmenopausal): Secondary | ICD-10-CM | POA: Insufficient documentation

## 2024-08-07 DIAGNOSIS — E063 Autoimmune thyroiditis: Secondary | ICD-10-CM | POA: Diagnosis not present

## 2024-08-07 DIAGNOSIS — I10 Essential (primary) hypertension: Secondary | ICD-10-CM | POA: Insufficient documentation

## 2024-08-07 HISTORY — PX: PARATHYROIDECTOMY: SHX19

## 2024-08-07 SURGERY — PARATHYROIDECTOMY
Anesthesia: General | Site: Neck | Laterality: Right

## 2024-08-07 MED ORDER — CHLORHEXIDINE GLUCONATE 0.12 % MT SOLN
15.0000 mL | Freq: Once | OROMUCOSAL | Status: AC
  Administered 2024-08-07: 08:00:00 15 mL via OROMUCOSAL

## 2024-08-07 MED ORDER — BUPIVACAINE HCL (PF) 0.5 % IJ SOLN
INTRAMUSCULAR | Status: AC
  Filled 2024-08-07: qty 30

## 2024-08-07 MED ORDER — TRAMADOL HCL 50 MG PO TABS: 50.0000 mg | ORAL_TABLET | Freq: Four times a day (QID) | ORAL | 0 refills | Status: AC | PRN

## 2024-08-07 MED ORDER — PROPOFOL 10 MG/ML IV BOLUS
INTRAVENOUS | Status: DC | PRN
  Administered 2024-08-07: 10:00:00 150 mg via INTRAVENOUS
  Administered 2024-08-07: 10:00:00 20 mg via INTRAVENOUS

## 2024-08-07 MED ORDER — PROPOFOL 500 MG/50ML IV EMUL
INTRAVENOUS | Status: DC | PRN
  Administered 2024-08-07: 10:00:00 60 ug/kg/min via INTRAVENOUS

## 2024-08-07 MED ORDER — FENTANYL CITRATE (PF) 100 MCG/2ML IJ SOLN
INTRAMUSCULAR | Status: AC
  Filled 2024-08-07: qty 2

## 2024-08-07 MED ORDER — ROCURONIUM BROMIDE 10 MG/ML (PF) SYRINGE
PREFILLED_SYRINGE | INTRAVENOUS | Status: DC | PRN
  Administered 2024-08-07: 10:00:00 50 mg via INTRAVENOUS
  Administered 2024-08-07: 10:00:00 20 mg via INTRAVENOUS

## 2024-08-07 MED ORDER — LACTATED RINGERS IV SOLN: INTRAVENOUS | Status: DC | PRN

## 2024-08-07 MED ORDER — ACETAMINOPHEN 10 MG/ML IV SOLN: 1000.0000 mg | Freq: Once | INTRAVENOUS | Status: DC | PRN

## 2024-08-07 MED ORDER — MIDAZOLAM HCL 5 MG/5ML IJ SOLN
INTRAMUSCULAR | Status: DC | PRN
  Administered 2024-08-07: 10:00:00 2 mg via INTRAVENOUS

## 2024-08-07 MED ORDER — FENTANYL CITRATE (PF) 50 MCG/ML IJ SOSY
25.0000 ug | PREFILLED_SYRINGE | INTRAMUSCULAR | Status: DC | PRN
  Administered 2024-08-07: 12:00:00 50 ug via INTRAVENOUS

## 2024-08-07 MED ORDER — MIDAZOLAM HCL 2 MG/2ML IJ SOLN
INTRAMUSCULAR | Status: AC
  Filled 2024-08-07: qty 2

## 2024-08-07 MED ORDER — DROPERIDOL 2.5 MG/ML IJ SOLN
INTRAMUSCULAR | Status: AC
  Filled 2024-08-07: qty 2

## 2024-08-07 MED ORDER — 0.9 % SODIUM CHLORIDE (POUR BTL) OPTIME
TOPICAL | Status: DC | PRN
  Administered 2024-08-07: 10:00:00 1000 mL

## 2024-08-07 MED ORDER — ORAL CARE MOUTH RINSE: 15.0000 mL | Freq: Once | OROMUCOSAL | Status: AC

## 2024-08-07 MED ORDER — PROPOFOL 10 MG/ML IV BOLUS
INTRAVENOUS | Status: AC
  Filled 2024-08-07: qty 20

## 2024-08-07 MED ORDER — SUGAMMADEX SODIUM 200 MG/2ML IV SOLN
INTRAVENOUS | Status: DC | PRN
  Administered 2024-08-07: 11:00:00 200 mg via INTRAVENOUS

## 2024-08-07 MED ORDER — ONDANSETRON HCL 4 MG/2ML IJ SOLN
INTRAMUSCULAR | Status: AC
  Filled 2024-08-07: qty 2

## 2024-08-07 MED ORDER — LIDOCAINE HCL (PF) 2 % IJ SOLN
INTRAMUSCULAR | Status: AC
  Filled 2024-08-07: qty 5

## 2024-08-07 MED ORDER — SUGAMMADEX SODIUM 200 MG/2ML IV SOLN
INTRAVENOUS | Status: AC
  Filled 2024-08-07: qty 4

## 2024-08-07 MED ORDER — LIDOCAINE 2% (20 MG/ML) 5 ML SYRINGE
INTRAMUSCULAR | Status: DC | PRN
  Administered 2024-08-07: 10:00:00 60 mg via INTRAVENOUS

## 2024-08-07 MED ORDER — CEFAZOLIN SODIUM-DEXTROSE 2-4 GM/100ML-% IV SOLN
2.0000 g | INTRAVENOUS | Status: AC
  Administered 2024-08-07: 10:00:00 2 g via INTRAVENOUS
  Filled 2024-08-07: qty 100

## 2024-08-07 MED ORDER — CHLORHEXIDINE GLUCONATE CLOTH 2 % EX PADS: 6.0000 | MEDICATED_PAD | Freq: Once | CUTANEOUS | Status: DC

## 2024-08-07 MED ORDER — FENTANYL CITRATE (PF) 50 MCG/ML IJ SOSY
PREFILLED_SYRINGE | INTRAMUSCULAR | Status: AC
  Filled 2024-08-07: qty 2

## 2024-08-07 MED ORDER — LACTATED RINGERS IV SOLN: INTRAVENOUS | Status: DC

## 2024-08-07 MED ORDER — DEXAMETHASONE SOD PHOSPHATE PF 10 MG/ML IJ SOLN
INTRAMUSCULAR | Status: DC | PRN
  Administered 2024-08-07: 10:00:00 10 mg via INTRAVENOUS

## 2024-08-07 MED ORDER — DROPERIDOL 2.5 MG/ML IJ SOLN
0.6250 mg | Freq: Once | INTRAMUSCULAR | Status: AC | PRN
  Administered 2024-08-07: 12:00:00 0.625 mg via INTRAVENOUS

## 2024-08-07 MED ORDER — ONDANSETRON HCL 4 MG/2ML IJ SOLN
INTRAMUSCULAR | Status: DC | PRN
  Administered 2024-08-07: 11:00:00 4 mg via INTRAVENOUS

## 2024-08-07 MED ORDER — HEMOSTATIC AGENTS (NO CHARGE) OPTIME
TOPICAL | Status: DC | PRN
  Administered 2024-08-07: 11:00:00 1 via TOPICAL

## 2024-08-07 MED ORDER — ROCURONIUM BROMIDE 10 MG/ML (PF) SYRINGE
PREFILLED_SYRINGE | INTRAVENOUS | Status: AC
  Filled 2024-08-07: qty 10

## 2024-08-07 MED ORDER — BUPIVACAINE HCL 0.25 % IJ SOLN
INTRAMUSCULAR | Status: DC | PRN
  Administered 2024-08-07: 11:00:00 8 mL

## 2024-08-07 MED ORDER — FENTANYL CITRATE (PF) 100 MCG/2ML IJ SOLN
INTRAMUSCULAR | Status: DC | PRN
  Administered 2024-08-07: 10:00:00 100 ug via INTRAVENOUS
  Administered 2024-08-07: 10:00:00 50 ug via INTRAVENOUS

## 2024-08-07 SURGICAL SUPPLY — 29 items
ATTRACTOMAT 16X20 MAGNETIC DRP (DRAPES) ×1 IMPLANT
BAG COUNTER SPONGE SURGICOUNT (BAG) ×1 IMPLANT
BLADE SURG 15 STRL LF DISP TIS (BLADE) ×1 IMPLANT
CHLORAPREP W/TINT 26 (MISCELLANEOUS) ×1 IMPLANT
CLIP TI MEDIUM 6 (CLIP) ×2 IMPLANT
CLIP TI WIDE RED SMALL 6 (CLIP) ×2 IMPLANT
COVER SURGICAL LIGHT HANDLE (MISCELLANEOUS) ×1 IMPLANT
DERMABOND ADVANCED .7 DNX12 (GAUZE/BANDAGES/DRESSINGS) ×1 IMPLANT
DRAPE LAPAROTOMY T 98X78 PEDS (DRAPES) ×1 IMPLANT
DRAPE UTILITY XL STRL (DRAPES) ×1 IMPLANT
ELECT PENCIL ROCKER SW 15FT (MISCELLANEOUS) ×1 IMPLANT
ELECT REM PT RETURN 15FT ADLT (MISCELLANEOUS) ×1 IMPLANT
GAUZE 4X4 16PLY ~~LOC~~+RFID DBL (SPONGE) ×1 IMPLANT
GLOVE SURG ORTHO 8.0 STRL STRW (GLOVE) ×1 IMPLANT
GOWN STRL REUS W/ TWL XL LVL3 (GOWN DISPOSABLE) ×3 IMPLANT
HEMOSTAT SURGICEL 2X4 FIBR (HEMOSTASIS) ×1 IMPLANT
ILLUMINATOR WAVEGUIDE N/F (MISCELLANEOUS) IMPLANT
KIT BASIN OR (CUSTOM PROCEDURE TRAY) ×1 IMPLANT
KIT TURNOVER KIT A (KITS) ×1 IMPLANT
NDL HYPO 22X1.5 SAFETY MO (MISCELLANEOUS) ×1 IMPLANT
NEEDLE HYPO 22X1.5 SAFETY MO (MISCELLANEOUS) ×1 IMPLANT
PACK BASIC VI WITH GOWN DISP (CUSTOM PROCEDURE TRAY) ×1 IMPLANT
SHEARS HARMONIC 9CM CVD (BLADE) ×1 IMPLANT
SUT MNCRL AB 4-0 PS2 18 (SUTURE) ×1 IMPLANT
SUT VIC AB 3-0 SH 18 (SUTURE) ×1 IMPLANT
SYR BULB IRRIG 60ML STRL (SYRINGE) ×1 IMPLANT
SYR CONTROL 10ML LL (SYRINGE) ×1 IMPLANT
TOWEL OR DSP ST BLU DLX 10/PK (DISPOSABLE) ×1 IMPLANT
TUBING CONNECTING 10 (TUBING) ×1 IMPLANT

## 2024-08-07 NOTE — Op Note (Signed)
 OPERATIVE REPORT - PARATHYROIDECTOMY  Preoperative diagnosis: Primary hyperparathyroidism  Postop diagnosis: Same  Procedure: Right inferior minimally invasive parathyroidectomy  Attending Surgeon:  Krystal Spinner, MD  Resident Surgeon:  Marolyn Big, MD ([facility])  Anesthesia: General endotracheal  Estimated blood loss: Minimal  Preparation: ChloraPrep  Indications: Patient is referred by Harlene Bill, NP, for surgical evaluation and recommendations regarding management of primary hyperparathyroidism. Patient's primary care physician is Dr. Ryan Hives. Patient was originally diagnosed with hypercalcemia approximately 3 years ago. He was evaluated by endocrinology. Patient has had persistent elevated calcium levels ranging up to 10.8. Intact PTH level is elevated now at 82. Patient has undergone imaging studies including an ultrasound examination and a nuclear medicine parathyroid  scan with sestamibi. Both of these failed to identify a parathyroid  adenoma. Patient was noted to have bilateral thyroid  nodules which will require annual ultrasound evaluation. Patient has developed symptoms. She notes significant fatigue. She has bone and joint discomfort. She denies nephrolithiasis. Patient describes brain fog. She does have osteoporosis. Patient has had no prior head or neck surgery. There is no family history of parathyroid  disease or other endocrine neoplasms.   Procedure: The patient was prepared in the pre-operative holding area. The patient was brought to the operating room and placed in a supine position on the operating room table. Following administration of general anesthesia, the patient was positioned and then prepped and draped in the usual strict aseptic fashion. After ascertaining that an adequate level of anesthesia been achieved, a neck incision was made with a #15 blade. Dissection was carried through subcutaneous tissues and platysma. Hemostasis was obtained with the  electrocautery. Skin flaps were developed circumferentially and a Weitlander retractor was placed for exposure.  Strap muscles were incised in the midline. Strap muscles were reflected laterally exposing the right thyroid  lobe. With gentle blunt dissection the thyroid  lobe was mobilized.  Dissection was carried posteriorly and a nodule was noted on the posterior lateral aspect of the inferior pole of the thyroid  gland.  This measured approximately 8 or 9 mm in diameter.  It was carefully dissected out.  It was somewhat adherent to the thyroid  parenchyma.  Using the harmonic scalpel and the electrocautery it was completely excised.  This was submitted to pathology.  Frozen section demonstrated follicular tissue.  Pathologist was unable to determine for certain whether this was thyroid  or parathyroid .  It will be submitted for additional stains.  The right thyroid  lobe was further mobilized.  Slightly superiorly and more posteriorly an enlarged parathyroid  gland was identified. It was gently mobilized. Vascular structures were divided between small ligaclips. Care was taken to avoid the recurrent laryngeal nerve. The parathyroid  gland was completely excised. It was submitted to pathology where frozen section confirmed hypercellular parathyroid  tissue consistent with adenoma.  Neck was irrigated with warm saline and good hemostasis was noted. Fibrillar was placed in the operative field. Strap muscles were approximated in the midline with interrupted 3-0 Vicryl sutures. Platysma was closed with interrupted 3-0 Vicryl sutures. Marcaine  was infiltrated circumferentially. Skin was closed with a running 4-0 Monocryl subcuticular suture. Wound was washed and dried and Dermabond was applied. Patient was awakened from anesthesia and brought to the recovery room. The patient tolerated the procedure well.   Krystal Spinner, MD [facility] Surgery Office: [phone]

## 2024-08-07 NOTE — Anesthesia Preprocedure Evaluation (Signed)
 Anesthesia Evaluation  Patient identified by MRN, date of birth, ID band Patient awake    Reviewed: Allergy & Precautions, NPO status , Patient's Chart, lab work & pertinent test results  History of Anesthesia Complications (+) PONV and history of anesthetic complications  Airway Mallampati: II  TM Distance: >3 FB Neck ROM: Full    Dental no notable dental hx.    Pulmonary neg pulmonary ROS, former smoker   Pulmonary exam normal        Cardiovascular hypertension,  Rhythm:Regular Rate:Normal     Neuro/Psych negative neurological ROS  negative psych ROS   GI/Hepatic Neg liver ROS,GERD  Medicated,,  Endo/Other  Hypothyroidism    Renal/GU negative Renal ROS  negative genitourinary   Musculoskeletal   Abdominal Normal abdominal exam  (+)   Peds  Hematology      Anesthesia Other Findings   Reproductive/Obstetrics                              Anesthesia Physical Anesthesia Plan  ASA: 2  Anesthesia Plan: General   Post-op Pain Management:    Induction: Intravenous  PONV Risk Score and Plan: 4 or greater and Ondansetron , Dexamethasone , Midazolam  and Treatment may vary due to age or medical condition  Airway Management Planned: Mask and Oral ETT  Additional Equipment: None  Intra-op Plan:   Post-operative Plan: Extubation in OR  Informed Consent: I have reviewed the patients History and Physical, chart, labs and discussed the procedure including the risks, benefits and alternatives for the proposed anesthesia with the patient or authorized representative who has indicated his/her understanding and acceptance.     Dental advisory given  Plan Discussed with: CRNA  Anesthesia Plan Comments:         Anesthesia Quick Evaluation

## 2024-08-07 NOTE — Interval H&P Note (Signed)
 History and Physical Interval Note:  08/07/2024 9:05 AM  Erica Hamilton  has presented today for surgery, with the diagnosis of PRIMARY HYPERPARATHYROIDECTOMY.  The various methods of treatment have been discussed with the patient and family. After consideration of risks, benefits and other options for treatment, the patient has consented to    Procedures with comments: PARATHYROIDECTOMY (Right) - RIGHT INFERIOR PARATHYROIDECTOMY as a surgical intervention.    The patient's history has been reviewed, patient examined, no change in status, stable for surgery.  I have reviewed the patient's chart and labs.  Questions were answered to the patient's satisfaction.    Krystal Spinner, MD [facility] Surgery [facility] practice Office: [phone]   Krystal Spinner

## 2024-08-07 NOTE — Anesthesia Procedure Notes (Signed)
 Procedure Name: Intubation Date/Time: 08/07/2024 9:49 AM  Performed by: Franchot Delon RAMAN, CRNAPre-anesthesia Checklist: Patient identified, Emergency Drugs available, Suction available and Patient being monitored Patient Re-evaluated:Patient Re-evaluated prior to induction Oxygen Delivery Method: Circle System Utilized Preoxygenation: Pre-oxygenation with 100% oxygen Induction Type: IV induction Ventilation: Mask ventilation without difficulty Laryngoscope Size: Mac and 3 Grade View: Grade I Tube type: Oral Tube size: 7.0 mm Number of attempts: 1 Airway Equipment and Method: Stylet and Oral airway Placement Confirmation: ETT inserted through vocal cords under direct vision, positive ETCO2 and breath sounds checked- equal and bilateral Secured at: 22 cm Tube secured with: Tape Dental Injury: Bloody posterior oropharynx  Comments: 1st Attempt by Madelaine KET. 2nd attempt [place], SCIENTIST, CLINICAL (HISTOCOMPATIBILITY AND IMMUNOGENETICS). Small amount of blood, not enough to suction.

## 2024-08-07 NOTE — Anesthesia Postprocedure Evaluation (Signed)
 Anesthesia Post Note  Patient: Erica Hamilton  Procedure(s) Performed: PARATHYROIDECTOMY (Right: Neck)     Patient location during evaluation: PACU Anesthesia Type: General Level of consciousness: awake and alert Pain management: pain level controlled Vital Signs Assessment: post-procedure vital signs reviewed and stable Respiratory status: spontaneous breathing, nonlabored ventilation, respiratory function stable and patient connected to nasal cannula oxygen Cardiovascular status: blood pressure returned to baseline and stable Postop Assessment: no apparent nausea or vomiting Anesthetic complications: no   No notable events documented.  Last Vitals:  Vitals:   08/07/24 1245 08/07/24 1303  BP: 137/75 (!) 149/82  Pulse: 74 81  Resp: 12 16  Temp:  36.6 C  SpO2: 91% 94%    Last Pain:  Vitals:   08/07/24 1303  TempSrc: Oral  PainSc: 2                  Cordella SQUIBB [name]

## 2024-08-07 NOTE — Transfer of Care (Signed)
 Immediate Anesthesia Transfer of Care Note  Patient: Erica Hamilton  Procedure(s) Performed: PARATHYROIDECTOMY (Right: Neck)  Patient Location: PACU  Anesthesia Type:General  Level of Consciousness: awake and drowsy  Airway & Oxygen Therapy: Patient Spontanous Breathing and Patient connected to face mask oxygen  Post-op Assessment: Report given to RN and Post -op Vital signs reviewed and stable  Post vital signs: Reviewed and stable  Last Vitals:  Vitals Value Taken Time  BP 151/87 08/07/24 11:23  Temp 36.6 C 08/07/24 11:23  Pulse 82 08/07/24 11:27  Resp 15 08/07/24 11:27  SpO2 96 % 08/07/24 11:27  Vitals shown include unfiled device data.  Last Pain:  Vitals:   08/07/24 1123  TempSrc:   PainSc: 0-No pain         Complications: No notable events documented.

## 2024-08-07 NOTE — Discharge Instructions (Addendum)

## 2024-08-08 ENCOUNTER — Ambulatory Visit: Payer: Self-pay | Admitting: Surgery

## 2024-08-08 ENCOUNTER — Encounter (HOSPITAL_COMMUNITY): Payer: Self-pay | Admitting: Surgery

## 2024-08-08 LAB — SURGICAL PATHOLOGY

## 2024-08-08 NOTE — Progress Notes (Signed)
 Pathology is all benign, good news.  tmg  Krystal Spinner, MD [facility] practice Office: [phone]

## 2024-08-18 DIAGNOSIS — E210 Primary hyperparathyroidism: Secondary | ICD-10-CM | POA: Diagnosis not present

## 2024-08-18 DIAGNOSIS — Z9889 Other specified postprocedural states: Secondary | ICD-10-CM | POA: Diagnosis not present

## 2024-08-18 DIAGNOSIS — Z9089 Acquired absence of other organs: Secondary | ICD-10-CM | POA: Diagnosis not present
